# Patient Record
Sex: Female | Born: 1980 | Race: White | Hispanic: No | Marital: Married | Smoking: Never smoker
Health system: Southern US, Community
[De-identification: ages and names within clinical notes are randomized; demographics above are authoritative.]

## PROBLEM LIST (undated history)

## (undated) DIAGNOSIS — M545 Low back pain, unspecified: Secondary | ICD-10-CM

## (undated) DIAGNOSIS — M79606 Pain in leg, unspecified: Secondary | ICD-10-CM

## (undated) DIAGNOSIS — F32A Depression, unspecified: Secondary | ICD-10-CM

## (undated) DIAGNOSIS — R519 Headache, unspecified: Secondary | ICD-10-CM

## (undated) DIAGNOSIS — E7800 Pure hypercholesterolemia, unspecified: Secondary | ICD-10-CM

## (undated) DIAGNOSIS — F329 Major depressive disorder, single episode, unspecified: Secondary | ICD-10-CM

## (undated) DIAGNOSIS — R739 Hyperglycemia, unspecified: Secondary | ICD-10-CM

## (undated) DIAGNOSIS — M4316 Spondylolisthesis, lumbar region: Secondary | ICD-10-CM

## (undated) DIAGNOSIS — E039 Hypothyroidism, unspecified: Secondary | ICD-10-CM

## (undated) DIAGNOSIS — F419 Anxiety disorder, unspecified: Secondary | ICD-10-CM

## (undated) HISTORY — PX: BACK SURGERY: SHX140

## (undated) HISTORY — PX: TUBAL LIGATION: SHX77

## (undated) HISTORY — DX: Depression, unspecified: F32.A

## (undated) HISTORY — PX: CHOLECYSTECTOMY: SHX55

## (undated) HISTORY — DX: Pain in leg, unspecified: M79.606

## (undated) HISTORY — DX: Anxiety disorder, unspecified: F41.9

## (undated) HISTORY — DX: Low back pain, unspecified: M54.50

## (undated) HISTORY — DX: Spondylolisthesis, lumbar region: M43.16

## (undated) HISTORY — DX: Major depressive disorder, single episode, unspecified: F32.9

---
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
## 2001-09-21 HISTORY — PX: OTHER SURGICAL HISTORY: SHX169

## 2005-09-17 ENCOUNTER — Observation Stay: Payer: Self-pay

## 2005-09-20 ENCOUNTER — Inpatient Hospital Stay: Payer: Self-pay

## 2007-10-17 ENCOUNTER — Observation Stay: Payer: Self-pay

## 2007-10-26 ENCOUNTER — Inpatient Hospital Stay: Payer: Self-pay

## 2009-03-27 ENCOUNTER — Inpatient Hospital Stay: Payer: Self-pay

## 2013-05-22 ENCOUNTER — Emergency Department: Payer: Self-pay | Admitting: Emergency Medicine

## 2017-08-23 DIAGNOSIS — F419 Anxiety disorder, unspecified: Secondary | ICD-10-CM | POA: Insufficient documentation

## 2018-06-06 ENCOUNTER — Other Ambulatory Visit: Payer: Self-pay | Admitting: Student

## 2018-06-06 DIAGNOSIS — M5441 Lumbago with sciatica, right side: Secondary | ICD-10-CM

## 2018-06-14 ENCOUNTER — Ambulatory Visit
Admission: RE | Admit: 2018-06-14 | Discharge: 2018-06-14 | Disposition: A | Discharge status: Home / Self Care | Payer: BLUE CROSS/BLUE SHIELD | Source: Ambulatory Visit | Attending: Student | Admitting: Student

## 2018-06-14 ENCOUNTER — Encounter: Payer: Self-pay | Admitting: Radiology

## 2018-06-14 DIAGNOSIS — M4316 Spondylolisthesis, lumbar region: Secondary | ICD-10-CM | POA: Insufficient documentation

## 2018-06-14 DIAGNOSIS — M5441 Lumbago with sciatica, right side: Secondary | ICD-10-CM | POA: Diagnosis present

## 2018-06-14 DIAGNOSIS — M5126 Other intervertebral disc displacement, lumbar region: Secondary | ICD-10-CM | POA: Diagnosis not present

## 2018-10-28 ENCOUNTER — Other Ambulatory Visit: Payer: Self-pay | Admitting: Neurosurgery

## 2018-10-28 DIAGNOSIS — M4316 Spondylolisthesis, lumbar region: Secondary | ICD-10-CM

## 2018-11-03 ENCOUNTER — Ambulatory Visit
Admission: RE | Admit: 2018-11-03 | Discharge: 2018-11-03 | Disposition: A | Discharge status: Home / Self Care | Payer: BLUE CROSS/BLUE SHIELD | Source: Ambulatory Visit | Attending: Neurosurgery | Admitting: Neurosurgery

## 2018-11-03 DIAGNOSIS — M4316 Spondylolisthesis, lumbar region: Secondary | ICD-10-CM | POA: Diagnosis present

## 2018-12-13 ENCOUNTER — Other Ambulatory Visit: Payer: Self-pay

## 2018-12-13 ENCOUNTER — Encounter: Payer: Self-pay | Admitting: Student in an Organized Health Care Education/Training Program

## 2018-12-13 ENCOUNTER — Ambulatory Visit
Payer: BLUE CROSS/BLUE SHIELD | Attending: Student in an Organized Health Care Education/Training Program | Admitting: Student in an Organized Health Care Education/Training Program

## 2018-12-13 VITALS — BP 117/80 | HR 88 | Resp 16 | Ht 63.0 in | Wt 140.0 lb

## 2018-12-13 DIAGNOSIS — M5441 Lumbago with sciatica, right side: Secondary | ICD-10-CM | POA: Insufficient documentation

## 2018-12-13 DIAGNOSIS — G8929 Other chronic pain: Secondary | ICD-10-CM | POA: Insufficient documentation

## 2018-12-13 DIAGNOSIS — M5416 Radiculopathy, lumbar region: Secondary | ICD-10-CM | POA: Diagnosis not present

## 2018-12-13 DIAGNOSIS — M4317 Spondylolisthesis, lumbosacral region: Secondary | ICD-10-CM | POA: Diagnosis present

## 2018-12-13 MED ORDER — PREGABALIN 75 MG PO CAPS: ORAL_CAPSULE | ORAL | 1 refills | Status: DC

## 2018-12-13 MED ORDER — PREGABALIN 75 MG PO CAPS: ORAL_CAPSULE | ORAL | 0 refills | Status: DC

## 2018-12-13 NOTE — Progress Notes (Signed)
Patient's Name: Jody Walls  MRN: [id number]  Referring Provider: Deetta Perla, MD  DOB: [birth year]  PCP: Derinda Late, MD  DOS: 12/13/2018  Note by: Gillis Santa, MD  Service setting: Ambulatory outpatient  Specialty: Interventional Pain Management  Location: [facility] (AMB) Pain Management Facility  Visit type: Initial Patient Evaluation  Patient type: New Patient   Primary Reason(s) for Visit: Encounter for initial evaluation of one or more chronic problems (new to examiner) potentially causing chronic pain, and posing a threat to normal musculoskeletal function. (Level of risk: High) CC: Back Pain (lower bilateral right is worse )  HPI  [name] is a 38 y.o. year old, female patient, who comes today to see [place] for the first time for an initial evaluation of her chronic pain. She has Lumbar radiculopathy, chronic; Spondylolisthesis at L5-S1 level; Chronic bilateral low back pain with right-sided sciatica; and Anxiety on their problem list. Today she comes in for evaluation of her Back Pain (lower bilateral right is worse )  Pain Assessment: Location: Lower, Left, Right Back Radiating: into both legs to the knee on the right and and the same on the left  Onset: More than a month ago Duration: Chronic pain Quality: Discomfort, Constant, Aching, Burning, Nagging, Sharp, Shooting, Stabbing(annoying, deep, sickening, uncomfortable.  feels like a shredding pain) Severity: 6 /10 (subjective, self-reported pain score)  Note: Reported level is inconsistent with clinical observations.                         When using our objective Pain Scale, levels between 6 and 10/10 are said to belong in an emergency room, as it progressively worsens from a 6/10, described as severely limiting, requiring emergency care not usually available at an outpatient pain management facility. At a 6/10 level, communication becomes difficult and requires great effort. Assistance to reach the emergency department may be  required. Facial flushing and profuse sweating along with potentially dangerous increases in heart rate and blood pressure will be evident. Effect on ADL: unable to do many things around the home,  has not worked since August d/t the pain  Timing: Constant Modifying factors: nothing currently BP: 117/80  HR: 88  Onset and Duration: Sudden Cause of pain: Work related accident or event Severity: Getting worse, NAS-11 at its worse: 8/10, NAS-11 at its best: 9/10, NAS-11 now: 10/10 and NAS-11 on the average: 7/10 Timing: Morning, Afternoon, Night and After activity or exercise Aggravating Factors: Bending, Intercourse (sex), Lifiting, Motion, Prolonged sitting, Prolonged standing, Twisting and Walking Alleviating Factors: Medications Associated Problems: Depression, Fatigue, Nausea, Sadness, Spasms, Pain that wakes patient up and Pain that does not allow patient to sleep Quality of Pain: Aching, Annoying, Burning, Constant, Deep, Sharp, Shooting, Sickening, Stabbing and Uncomfortable Previous Examinations or Tests: CT scan, MRI scan, X-rays and Neurological evaluation Previous Treatments: The patient denies treatments  The patient comes into the clinics today for the first time for a chronic pain management evaluation.   38 year old female who presents with a chief complaint of axial low back as well as right leg pain.  This is been going on for the last 6 to 8 months.  She does remember an inciting event where she stood up suddenly and developed pain down her right buttock.  She states that this improved on her own.  She has been evaluated by neurosurgery, Dr. Lacinda Axon who recommended surgery however not until her BMI was less than 35.  Patient has tried gabapentin in  the past which resulted in sedation.  She is currently on Lyrica 50 mg twice a day which she finds some benefit from.  No side effects with Lyrica.  She has participated in physical therapy and tries to exercise but has seen a weight gain  of approximately 20 pounds.  She does have a history of anxiety and depression.  Patient has tried injections in the past with Dr. Sharlet Salina the last of which was on 09/22/2018 includes a right L5-S1 transfemoral ESI along with a right S1 transforaminal ESI.  She has had 2 prior transforaminal ESI's done in November 2019 in October 2019 which provided good relief for 4 to 7 days.  Patient states that she obtained a second opinion from Dr. Arnoldo Morale at [facility] neuro[facility].  She is a surgical candidate and surgery is planned however a vascular work-up was recommended prior to surgery however given the current situation with a coronavirus, the patient has not had that vascular evaluation done yet.  She presents today to discuss any interim options to help with her radicular pain.  Patient denies any bowel or bladder issues.  Patient is currently not any opioid analgesics.  We will focus on non-opioid analgesics and interventional therapies until the patient is able to have her lumbar spine surgery performed.   Historic Controlled Substance Pharmacotherapy Review  Historical Background Evaluation: [place] PMP: Six (6) year initial data search conducted.             Risk Assessment Profile:  Personal History of Substance Abuse (SUD-Substance use disorder):  Alcohol: Negative  Illegal Drugs: Negative  Rx Drugs: Negative  ORT Risk Level calculation: Moderate Risk Opioid Risk Tool - 12/13/18 1047      Family History of Substance Abuse   Alcohol  Negative    Illegal Drugs  Positive Female   father and 2 brothers    Rx Drugs  Negative      Personal History of Substance Abuse   Alcohol  Negative    Illegal Drugs  Negative    Rx Drugs  Negative      Age   Age between 51-45 years   Yes      Psychological Disease   Psychological Disease  Positive    ADD  Negative    OCD  Negative    Bipolar  Negative    Schizophrenia  Negative    Depression  Positive      Total Score   Opioid Risk Tool  Scoring  6    Opioid Risk Interpretation  Moderate Risk      ORT Scoring interpretation table:  Score <3 = Low Risk for SUD  Score between 4-7 = Moderate Risk for SUD  Score >8 = High Risk for Opioid Abuse   PHQ-2 Depression Scale:  Total score:    PHQ-2 Scoring interpretation table: (Score and probability of major depressive disorder)  Score 0 = No depression  Score 1 = 15.4% Probability  Score 2 = 21.1% Probability  Score 3 = 38.4% Probability  Score 4 = 45.5% Probability  Score 5 = 56.4% Probability  Score 6 = 78.6% Probability   PHQ-9 Depression Scale:  Total score:    PHQ-9 Scoring interpretation table:  Score 0-4 = No depression  Score 5-9 = Mild depression  Score 10-14 = Moderate depression  Score 15-19 = Moderately severe depression  Score 20-27 = Severe depression (2.4 times higher risk of SUD and 2.89 times higher risk of overuse)   Pharmacologic  Plan: Non-opioid analgesic therapy offered. Ms. Myers has expressed her preference to stay away from controlled substances. Initial impression: Pending review of available data and ordered tests.  Meds   Current Outpatient Medications:  .  levothyroxine (SYNTHROID, LEVOTHROID) 50 MCG tablet, Take 50 mcg by mouth daily., Disp: , Rfl:  .  meloxicam (MOBIC) 15 MG tablet, Take 15 mg by mouth daily., Disp: , Rfl:  .  oxybutynin (DITROPAN-XL) 10 MG 24 hr tablet, Take 10 mg by mouth daily., Disp: , Rfl:  .  pregabalin (LYRICA) 75 MG capsule, 75 mg BID for 4 weeks then increase to 75 mg TID, Disp: 90 capsule, Rfl: 1  Imaging Review  Thoracic DG Myelogram views: No results found for this or any previous visit.  Lumbosacral Imaging: Lumbar MR wo contrast:  Results for orders placed during the hospital encounter of 06/14/18  MR LUMBAR SPINE WO CONTRAST   Narrative CLINICAL DATA:  38 year old female with lumbar back pain since July. No known injury. No improvement with physical therapy. Pain radiating down the right leg to  the ankle.  EXAM: MRI LUMBAR SPINE WITHOUT CONTRAST  TECHNIQUE: Multiplanar, multisequence MR imaging of the lumbar spine was performed. No intravenous contrast was administered.  COMPARISON:  Abdominal radiographs 03/27/2009.  FINDINGS: Segmentation:  Normal on the comparison.  Alignment: Mild grade 1 anterolisthesis of L5 on S1. Preserved lumbar lordosis.  Vertebrae: Chronic bilateral pars fracture suspected at L5, with associated degenerative marrow signal changes in the facets. Elsewhere bone marrow signal is within normal limits. No marrow edema or evidence of acute osseous abnormality. Intact visible sacrum and SI joints.  Conus medullaris and cauda equina: Conus extends to the T12-L1 level. No lower spinal cord or conus signal abnormality.  Paraspinal and other soft tissues: Negative.  Disc levels:  T11-T12: Negative.  T12-L1:  Negative.  L1-L2:  Negative.  L2-L3:  Negative aside from borderline to mild facet hypertrophy.  L3-L4:  Negative aside from mild facet hypertrophy.  L4-L5: Mild disc desiccation. Minimal central disc protrusion with annular fissure best seen on series 4, image 8. Mild to moderate facet hypertrophy. No spinal stenosis or convincing neural impingement. Additionally, there are several small posteriorly situated degenerative synovial cysts which should not cause neural compromise.  L5-S1: Grade 1 anterolisthesis. Evidence of chronic bilateral pars fractures on series 5, image 33. Disc desiccation with mild circumferential disc bulging and superimposed moderate to large right foraminal disc extrusion (series 4, image 4). Superimposed moderate facet hypertrophy. Subsequent severe right L5 neural foraminal stenosis.  No spinal or lateral recess stenosis. Mild contralateral left L5 foraminal stenosis.  IMPRESSION: 1. The symptomatic level appears to be L5-S1 where chronic L5 pars fractures are associated with grade one L5-S1  spondylolisthesis, disc and facet degeneration. There is a moderate to large right foraminal disc extrusion, query Right L5 radiculitis. 2. Lesser L4-L5 disc and posterior element degeneration. No spinal stenosis or convincing neural impingement.   Electronically Signed   By: Genevie Ann M.D.   On: 06/14/2018 15:04    Results for orders placed during the hospital encounter of 11/03/18  CT LUMBAR SPINE WO CONTRAST   Narrative CLINICAL DATA:  Low back pain.  EXAM: CT LUMBAR SPINE WITHOUT CONTRAST  TECHNIQUE: Multidetector CT imaging of the lumbar spine was performed without intravenous contrast administration. Multiplanar CT image reconstructions were also generated.  COMPARISON:  MRI lumbar spine 06/14/2018.  FINDINGS: Segmentation: Standard.  Alignment: 3 mm anterolisthesis L5-5 S1, BILATERAL L5 trace retrolisthesis L4-5 is compensatory.  Otherwise anatomic. Spondylolysis.  Vertebrae: Other than BILATERAL L5 pars defects, no significant findings.  Paraspinal and other soft tissues: Unremarkable.  Disc levels:  L1-L2:  Normal.  L2-L3:  Normal.  L3-L4:  Minor facet disease. No impingement.  L4-L5: Trace retrolisthesis. Annular bulge. Facet arthropathy. No impingement.  L5-S1: 3 mm anterolisthesis. BILATERAL L5 spondylolysis. No facet arthropathy. Annular bulge centrally, with far-lateral and foraminal protrusion extending to the RIGHT, along with mild osseous spurring. Significant RIGHT L5 neural impingement is observed, see series 4, image 28. Foraminal narrowing on the LEFT without similar LEFT L5 neural impingement.  Compared with prior MR, similar appearance.  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 Significant RIGHT L5 neural impingement is observed, related to foraminal protrusion and osseous spurring. See discussion above.   Electronically Signed   By: Staci Righter M.D.   On: 11/03/2018 16:10     Complexity Note:  Imaging results reviewed. Results shared with Ms. Monte, using Layman's terms.                         ROS  Cardiovascular: No reported cardiovascular signs or symptoms such as High blood pressure, coronary artery disease, abnormal heart rate or rhythm, heart attack, blood thinner therapy or heart weakness and/or failure Pulmonary or Respiratory: No reported pulmonary signs or symptoms such as wheezing and difficulty taking a deep full breath (Asthma), difficulty blowing air out (Emphysema), coughing up mucus (Bronchitis), persistent dry cough, or temporary stoppage of breathing during sleep Neurological: No reported neurological signs or symptoms such as seizures, abnormal skin sensations, urinary and/or fecal incontinence, being born with an abnormal open spine and/or a tethered spinal cord Review of Past Neurological Studies: No results found for this or any previous visit. Psychological-Psychiatric: No reported psychological or psychiatric signs or symptoms such as difficulty sleeping, anxiety, depression, delusions or hallucinations (schizophrenial), mood swings (bipolar disorders) or suicidal ideations or attempts Gastrointestinal: No reported gastrointestinal signs or symptoms such as vomiting or evacuating blood, reflux, heartburn, alternating episodes of diarrhea and constipation, inflamed or scarred liver, or pancreas or irrregular and/or infrequent bowel movements Genitourinary: No reported renal or genitourinary signs or symptoms such as difficulty voiding or producing urine, peeing blood, non-functioning kidney, kidney stones, difficulty emptying the bladder, difficulty controlling the flow of urine, or chronic kidney disease Hematological: Brusing easily Endocrine: High thyroid Rheumatologic: No reported rheumatological signs and symptoms such as fatigue, joint pain, tenderness, swelling, redness, heat, stiffness, decreased range of motion, with or without associated  rash Musculoskeletal: Negative for myasthenia gravis, muscular dystrophy, multiple sclerosis or malignant hyperthermia Work History: Out of work due to pain  Allergies   Component Name 12/01/2018 08/25/2018 08/26/2017   92 82 95  142 140 143  3.8 3.8 3.6  107 106 107  26.8 25.8 30.0  15 14 13   0.6 0.6 0.7  111 111 94  8.7 8.8 9.1  15 31 13  16  40 (H) 11  79 86 89  4.2 4.0 4.1  0.5 0.4 0.5  6.6 6.3 6.6  1.8 1.7 1.6  Glucose  Sodium  Potassium  Chloride  Carbon Dioxide (CO2)  Urea Nitrogen (BUN)  Creatinine  Glomerular Filtration Rate (eGFR), MDRD Estimate  Calcium  AST   ALT   Alk Phos (alkaline Phosphatase)  Albumin  Bilirubin, Total  Protein, Total  A/G Ratio     Note: Lab results reviewed.  PFSH  Drug: Ms. Yellin  reports no history of  drug use. Alcohol:  reports current alcohol use. Tobacco:  reports that she has never smoked. She has never used smokeless tobacco. Medical:  has a past medical history of Anxiety and Depression. Family: family history includes COPD in her mother; Cancer in her father and mother; Drug abuse in her brother and brother; Heart disease in her mother.  Past Surgical History:  Procedure Laterality Date  . arm fracture Right 2003  . CESAREAN SECTION  2010  . CHOLECYSTECTOMY    . TUBAL LIGATION     Active Ambulatory Problems    Diagnosis Date Noted  . Lumbar radiculopathy, chronic 12/14/2018  . Spondylolisthesis at L5-S1 level 12/14/2018  . Chronic bilateral low back pain with right-sided sciatica 12/14/2018  . Anxiety 08/23/2017   Resolved Ambulatory Problems    Diagnosis Date Noted  . No Resolved Ambulatory Problems   Past Medical History:  Diagnosis Date  . Depression    Constitutional Exam  General appearance: Well nourished, well developed, and well hydrated. In no apparent acute distress Vitals:   12/13/18 1034  BP: 117/80  Pulse: 88  Resp: 16  SpO2: 97%  Weight: 140 lb (63.5 kg)  Height: 5' 3"  (1.6 m)    BMI Assessment: Estimated body mass index is 24.8 kg/m as calculated from the following:   Height as of this encounter: 5' 3"  (1.6 m).   Weight as of this encounter: 140 lb (63.5 kg).  BMI interpretation table: BMI level Category Range association with higher incidence of chronic pain  <18 kg/m2 Underweight   18.5-24.9 kg/m2 Ideal body weight   25-29.9 kg/m2 Overweight Increased incidence by 20%  30-34.9 kg/m2 Obese (Class I) Increased incidence by 68%  35-39.9 kg/m2 Severe obesity (Class II) Increased incidence by 136%  >40 kg/m2 Extreme obesity (Class III) Increased incidence by 254%   Patient's current BMI Ideal Body weight  Body mass index is 24.8 kg/m. Ideal body weight: 52.4 kg (115 lb 8.3 oz) Adjusted ideal body weight: 56.8 kg (125 lb 5 oz)   BMI Readings from Last 4 Encounters:  12/13/18 24.80 kg/m   Wt Readings from Last 4 Encounters:  12/13/18 140 lb (63.5 kg)  Psych/Mental status: Alert, oriented x 3 (person, place, & time)       Eyes: PERLA Respiratory: No evidence of acute respiratory distress  Cervical Spine Area Exam  Skin & Axial Inspection: No masses, redness, edema, swelling, or associated skin lesions Alignment: Symmetrical Functional ROM: Unrestricted ROM      Stability: No instability detected Muscle Tone/Strength: Functionally intact. No obvious neuro-muscular anomalies detected. Sensory (Neurological): Unimpaired Palpation: No palpable anomalies              Upper Extremity (UE) Exam    Side: Right upper extremity  Side: Left upper extremity  Skin & Extremity Inspection: Skin color, temperature, and hair growth are WNL. No peripheral edema or cyanosis. No masses, redness, swelling, asymmetry, or associated skin lesions. No contractures.  Skin & Extremity Inspection: Skin color, temperature, and hair growth are WNL. No peripheral edema or cyanosis. No masses, redness, swelling, asymmetry, or associated skin lesions. No contractures.  Functional ROM:  Unrestricted ROM          Functional ROM: Unrestricted ROM          Muscle Tone/Strength: Functionally intact. No obvious neuro-muscular anomalies detected.  Muscle Tone/Strength: Functionally intact. No obvious neuro-muscular anomalies detected.  Sensory (Neurological): Unimpaired          Sensory (Neurological): Unimpaired  Palpation: No palpable anomalies              Palpation: No palpable anomalies              Provocative Test(s):  Phalen's test: deferred Tinel's test: deferred Apley's scratch test (touch opposite shoulder):  Action 1 (Across chest): deferred Action 2 (Overhead): deferred Action 3 (LB reach): deferred   Provocative Test(s):  Phalen's test: deferred Tinel's test: deferred Apley's scratch test (touch opposite shoulder):  Action 1 (Across chest): deferred Action 2 (Overhead): deferred Action 3 (LB reach): deferred    Thoracic Spine Area Exam  Skin & Axial Inspection: No masses, redness, or swelling Alignment: Symmetrical Functional ROM: Unrestricted ROM Stability: No instability detected Muscle Tone/Strength: Functionally intact. No obvious neuro-muscular anomalies detected. Sensory (Neurological): Unimpaired Muscle strength & Tone: No palpable anomalies  Lumbar Spine Area Exam  Skin & Axial Inspection: No masses, redness, or swelling Alignment: Symmetrical Functional ROM: Decreased ROM affecting primarily the right Stability: No instability detected Muscle Tone/Strength: Functionally intact. No obvious neuro-muscular anomalies detected. Sensory (Neurological): Dermatomal pain pattern and musculoskeletal Palpation: No palpable anomalies       Provocative Tests: Hyperextension/rotation test: (+) bilaterally for facet joint pain. Lumbar quadrant test (Kemp's test): (+) on the right for foraminal stenosis Lateral bending test: (+) due to pain. Patrick's Maneuver: deferred today                   FABER* test: deferred today                   S-I  anterior distraction/compression test: deferred today         S-I lateral compression test: deferred today         S-I Thigh-thrust test: deferred today         S-I Gaenslen's test: deferred today         *(Flexion, ABduction and External Rotation)  Gait & Posture Assessment  Ambulation: Unassisted Gait: Relatively normal for age and body habitus Posture: WNL   Lower Extremity Exam    Side: Right lower extremity  Side: Left lower extremity  Stability: No instability observed          Stability: No instability observed          Skin & Extremity Inspection: Skin color, temperature, and hair growth are WNL. No peripheral edema or cyanosis. No masses, redness, swelling, asymmetry, or associated skin lesions. No contractures.  Skin & Extremity Inspection: Skin color, temperature, and hair growth are WNL. No peripheral edema or cyanosis. No masses, redness, swelling, asymmetry, or associated skin lesions. No contractures.  Functional ROM: Pain restricted ROM for hip and knee joints          Functional ROM: Unrestricted ROM                  Muscle Tone/Strength: Functionally intact. No obvious neuro-muscular anomalies detected.  Muscle Tone/Strength: Functionally intact. No obvious neuro-muscular anomalies detected.  Sensory (Neurological): Dermatomal pain pattern        Sensory (Neurological): Unimpaired        DTR: Patellar: 2+: normal Achilles: deferred today Plantar: deferred today  DTR: Patellar: 2+: normal Achilles: deferred today Plantar: deferred today  Palpation: No palpable anomalies  Palpation: No palpable anomalies   Assessment  Primary Diagnosis & Pertinent Problem List: The primary encounter diagnosis was Lumbar radicular pain. Diagnoses of Lumbar radiculopathy, chronic, Spondylolisthesis at L5-S1 level, and Chronic bilateral low back pain with right-sided sciatica were  also pertinent to this visit.  Visit Diagnosis (New problems to examiner): 1. Lumbar radicular pain   2.  Lumbar radiculopathy, chronic   3. Spondylolisthesis at L5-S1 level   4. Chronic bilateral low back pain with right-sided sciatica    38 year old female with history of axial low back and right leg pain related to right L5 radiculopathy as well as L5-S1 spondylolisthesis and pars defect at L5-S1.  There is also some right foraminal stenosis at L5-S1 due to disc herniation and osteophyte complex.  Patient is status post transforaminal epidural steroid injections towards the end of last year with physical medicine rehab which provided pain relief for a couple of days.  She denies having tried intralaminar epidural steroid injection.  This approach may be beneficial and provide greater pain relief as we can inject larger volume and cover more nerve roots with 1 injection.  Risks and benefits of this procedure were reviewed and patient would like to proceed.  I also briefly discussed spinal cord stimulation with the patient however since the patient has been evaluated by Dr. Arnoldo Morale with neurosurgery and surgical plan has been recommended, I will hold off on discussing this further with the patient.  Patient is also finding benefit with Lyrica without any side effects.  I encouraged her to increase to 75 mg twice daily for 3 to 4 weeks and then increase to 75 mg 3 times daily.  Prescription provided.  Plan of Care (Initial workup plan)   Ordered Lab-work, Procedure(s), Referral(s), & Consult(s): Orders Placed This Encounter  Procedures  . Lumbar Epidural Injection   Pharmacotherapy (current): Medications ordered:  Meds ordered this encounter  Medications  . DISCONTD: pregabalin (LYRICA) 75 MG capsule    Sig: Take 1 capsule (75 mg total) by mouth 2 (two) times daily for 21 days, THEN 1 capsule (75 mg total) 3 (three) times daily.    Dispense:  159 capsule    Refill:  0    Do not place this medication, or any other prescription from our practice, on "Automatic Refill". Patient may have  prescription filled one day early if pharmacy is closed on scheduled refill date.  Marland Kitchen DISCONTD: pregabalin (LYRICA) 75 MG capsule    Sig: 75 mg BID for 4 weeks then increase to 75 mg TID    Dispense:  90 capsule    Refill:  1    Do not place this medication, or any other prescription from our practice, on "Automatic Refill". Patient may have prescription filled one day early if pharmacy is closed on scheduled refill date.  . pregabalin (LYRICA) 75 MG capsule    Sig: 75 mg BID for 4 weeks then increase to 75 mg TID    Dispense:  90 capsule    Refill:  1    Do not place this medication, or any other prescription from our practice, on "Automatic Refill". Patient may have prescription filled one day early if pharmacy is closed on scheduled refill date.   Medications administered during this visit: Evaline B. Hoefer had no medications administered during this visit.   Provider-requested follow-up: Return for Procedure.  No future appointments.  Primary Care Physician: Derinda Late, MD Location: [facility] [facility] Note by: Gillis Santa, M.D, Date: 12/13/2018; Time: 8:15 AM  Patient Instructions   Preparing for your procedure (without sedation) Instructions: . Oral Intake: Do not eat or drink anything for at least 3 hours prior to your procedure. . Transportation: Unless otherwise stated by your physician, you  may drive yourself after the procedure. . Blood Pressure Medicine: Take your blood pressure medicine with a sip of water the morning of the procedure. . Insulin: Take only  of your normal insulin dose. . Preventing infections: Shower with an antibacterial soap the morning of your procedure. . Build-up your immune system: Take 1000 mg of Vitamin C with every meal (3 times a day) the day prior to your procedure. . Pregnancy: If you are pregnant, call and cancel the procedure. . Sickness: If you have a cold, fever, or any active infections, call and cancel the  procedure. . Arrival: You must be in the facility at least 30 minutes prior to your scheduled procedure. . Children: Do not bring any children with you. . Dress appropriately: Bring dark clothing that you would not mind if they get stained. . Valuables: Do not bring any jewelry or valuables. Procedure appointments are reserved for interventional treatments only. Marland Kitchen No Prescription Refills. . No medication changes will be discussed during procedure appointments. No disability issues will be discussed. Epidural Steroid Injection An epidural steroid injection is given to relieve pain in your neck, back, or legs that is caused by the irritation or swelling of a nerve root. This procedure involves injecting a steroid and numbing medicine (anesthetic) into the epidural space. The epidural space is the space between the outer covering of your spinal cord and the bones that form your backbone (vertebra).  LET [facility] CARE PROVIDER KNOW ABOUT:  Any allergies you have. All medicines you are taking, including vitamins, herbs, eye drops, creams, and over-the-counter medicines such as aspirin. Previous problems you or members of your family have had with the use of anesthetics. Any blood disorders or blood clotting disorders you have. Previous surgeries you have had. Medical conditions you have.  RISKS AND COMPLICATIONS Generally, this is a safe procedure. However, as with any procedure, complications can occur. Possible complications of epidural steroid injection include: Headache. Bleeding. Infection. Allergic reaction to the medicines. Damage to your nerves. The response to this procedure depends on the underlying cause of the pain and its duration. People who have long-term (chronic) pain are less likely to benefit from epidural steroids than are those people whose pain comes on strong and suddenly.  BEFORE THE PROCEDURE  Ask your health care provider about changing or stopping your regular  medicines. You may be advised to stop taking blood-thinning medicines a few days before the procedure. You may be given medicines to reduce anxiety. Arrange for someone to take you home after the procedure.  PROCEDURE  You will remain awake during the procedure. You may receive medicine to make you relaxed. You will be asked to lie on your stomach. The injection site will be cleaned. The injection site will be numbed with a medicine (local anesthetic). A needle will be injected through your skin into the epidural space. Your health care provider will use an X-ray machine to ensure that the steroid is delivered closest to the affected nerve. You may have minimal discomfort at this time. Once the needle is in the right position, the local anesthetic and the steroid will be injected into the epidural space. The needle will then be removed and a bandage will be applied to the injection site.  AFTER THE PROCEDURE  You may be monitored for a short time before you go home. You may feel weakness or numbness in your arm or leg, which disappears within hours. You may be allowed to eat, drink, and take  your regular medicine. You may have soreness at the site of the injection.   This information is not intended to replace advice given to you by your health care provider. Make sure you discuss any questions you have with your health care provider.   Document Released: 12/15/2007 Document Revised: 05/10/2013 Document Reviewed: 02/24/2013 Elsevier Interactive Patient Education 2016 [place]  What are the risk, side effects and possible complications? Generally speaking, most procedures are safe.  However, with any procedure there are risks, side effects, and the possibility of complications.  The risks and complications are dependent upon the sites that are lesioned, or the type of nerve block to be performed.  The closer the procedure is to the spine, the more  serious the risks are.  Great care is taken when placing the radio frequency needles, block needles or lesioning probes, but sometimes complications can occur. Infection: Any time there is an injection through the skin, there is a risk of infection.  This is why sterile conditions are used for these blocks. There are four possible types of infection: 1. Localized skin infection. 2. Central Nervous System Infection: This can be in the form of Meningitis, which can be deadly. 3. Epidural Infections: This can be in the form of an epidural abscess, which can cause pressure inside of the spine, causing compression of the spinal cord with subsequent paralysis. This would require an emergency surgery to decompress, and there are no guarantees that the patient would recover from the paralysis. 4. Discitis: This is an infection of the intervertebral discs. It occurs in about 1% of discography procedures. It is difficult to treat and it may lead to surgery. Pain: the needles have to go through skin and soft tissues, will cause soreness. Damage to internal structures:  The nerves to be lesioned may be near blood vessels or other nerves which can be potentially damaged. Bleeding: Bleeding is more common if the patient is taking blood thinners such as  aspirin, Coumadin, Ticiid, Plavix, etc., or if he/she have some genetic predisposition such as hemophilia. Bleeding into the spinal canal can cause compression of the spinal  cord with subsequent paralysis.  This would require an emergency surgery to decompress and there are no guarantees that the patient would recover from the paralysis. Pneumothorax: Puncturing of a lung is a possibility, every time a needle is introduced in the area of the chest or upper back.  Pneumothorax refers to free air around the collapsed lung(s), inside of the thoracic cavity (chest cavity).  Another two possible complications related to a similar event would include: Hemothorax and  Chylothorax. These are variations of the Pneumothorax, where instead of air around the collapsed lung(s), you may have blood or chyle, respectively. Spinal headaches: They may occur with any procedures in the area of the spine. Persistent CSF (Cerebro-Spinal Fluid) leakage: This is a rare problem, but may occur with prolonged intrathecal or epidural catheters either due to the formation of a fistulous track or a dural tear. Nerve damage: By working so close to the spinal cord, there is always a possibility of nerve damage, which could be as serious as a permanent spinal cord injury with paralysis. Death: Although rare, severe deadly allergic reactions known as "Anaphylactic reaction" can occur to any of the medications used. Worsening of the symptoms: We can always make thing worse.  What are the chances of something like this happening? Chances of any of this occuring are extremely low.  By statistics, you have more of a chance of getting killed in a motor vehicle accident: while driving to the hospital than any of the above occurring .  Nevertheless, you should be aware that they are possibilities.  In general, it is similar to taking a shower.  Everybody knows that you can slip, hit your head and get killed.  Does that mean that you should not shower again?  Nevertheless always keep in mind that statistics do not mean anything if you happen to be on the wrong side of them.  Even if a procedure has a 1 (one) in a 1,000,000 (million) chance of going wrong, it you happen to be that one..Also, keep in mind that by statistics, you have more of a chance of having something go wrong when taking medications.  Who should not have this procedure? If you are on a blood thinning medication (e.g. Coumadin, Plavix, see list of "Blood Thinners"), or if you have an active infection going on, you should not have the procedure.  If you are taking any blood thinners, please inform your physician.  Preparing for your  procedure: Do not eat or drink anything at least eight (8) hours prior to the procedure. Bring a driver with you .  It cannot be a taxi. Come accompanied by an adult that can drive you back, and that is strong enough to help you if your legs get weak or numb from the local anesthetic. Take all of your medicines the morning of the procedure with just enough water to swallow them. If you have diabetes, make sure that you are scheduled to have your procedure done first thing in the morning, whenever possible. If you have diabetes, take only half of your insulin dose and notify our nurse that you have done so as soon as you arrive at the clinic. If you are diabetic, but only take blood sugar pills (oral hypoglycemic), then do not take them on the morning of your procedure.  You may take them after you have had the procedure. Do not take aspirin or any aspirin-containing medications, at least eleven (11) days prior to the procedure.  They may prolong bleeding. Wear loose fitting clothing that may be easy to take off and that you would not mind if it got stained with Betadine or blood. Do not wear any jewelry or perfume Remove any nail coloring.  It will interfere with some of our monitoring equipment. If you take Metformin for your diabetes, stop it 48 hours prior to the procedure.  NOTE: Remember that this is not meant to be interpreted as a complete list of all possible complications.  Unforeseen problems may occur.  BLOOD THINNERS The following drugs contain aspirin or other products, which can cause increased bleeding during surgery and should not be taken for 2 weeks prior to and 1 week after surgery.  If you should need take something for relief of minor pain, you may take acetaminophen which is found in Tylenol,m Datril, Anacin-3 and Panadol. It is not blood thinner. The products listed below are.  Do not take any of the products listed below in addition to any listed on your instruction  sheet.  A.P.C or A.P.C with Codeine Codeine Phosphate Capsules #3 Ibuprofen Ridaura  ABC compound Congesprin Imuran rimadil  Advil Cope Indocin Robaxisal  Alka-Seltzer Effervescent Pain Reliever and Antacid Coricidin or Coricidin-D  Indomethacin Rufen  Alka-Seltzer plus Cold Medicine Cosprin Ketoprofen S-A-C Tablets  Anacin Analgesic Tablets or Capsules Coumadin Korlgesic Salflex  Anacin Extra Strength Analgesic tablets or capsules CP-2 Tablets Lanoril Salicylate  Anaprox Cuprimine Capsules Levenox Salocol  Anexsia-D Dalteparin Magan Salsalate  Anodynos Darvon compound Magnesium Salicylate Sine-off  Ansaid Dasin Capsules Magsal Sodium Salicylate  Anturane Depen Capsules Marnal Soma  APF Arthritis pain formula Dewitt's Pills Measurin Stanback  Argesic Dia-Gesic Meclofenamic Sulfinpyrazone  Arthritis Bayer Timed Release Aspirin Diclofenac Meclomen Sulindac  Arthritis pain formula Anacin Dicumarol Medipren Supac  Analgesic (Safety coated) Arthralgen Diffunasal Mefanamic Suprofen  Arthritis Strength Bufferin Dihydrocodeine Mepro Compound Suprol  Arthropan liquid Dopirydamole Methcarbomol with Aspirin Synalgos  ASA tablets/Enseals Disalcid Micrainin Tagament  Ascriptin Doan's Midol Talwin  Ascriptin A/D Dolene Mobidin Tanderil  Ascriptin Extra Strength Dolobid Moblgesic Ticlid  Ascriptin with Codeine Doloprin or Doloprin with Codeine Momentum Tolectin  Asperbuf Duoprin Mono-gesic Trendar  Aspergum Duradyne Motrin or Motrin IB Triminicin  Aspirin plain, buffered or enteric coated Durasal Myochrisine Trigesic  Aspirin Suppositories Easprin Nalfon Trillsate  Aspirin with Codeine Ecotrin Regular or Extra Strength Naprosyn Uracel  Atromid-S Efficin Naproxen Ursinus  Auranofin Capsules Elmiron Neocylate Vanquish  Axotal Emagrin Norgesic Verin  Azathioprine Empirin or Empirin with Codeine Normiflo Vitamin E  Azolid Emprazil Nuprin Voltaren  Bayer Aspirin plain, buffered or children's or  timed BC Tablets or powders Encaprin Orgaran Warfarin Sodium  Buff-a-Comp Enoxaparin Orudis Zorpin  Buff-a-Comp with Codeine Equegesic Os-Cal-Gesic   Buffaprin Excedrin plain, buffered or Extra Strength Oxalid   Bufferin Arthritis Strength Feldene Oxphenbutazone   Bufferin plain or Extra Strength Feldene Capsules Oxycodone with Aspirin   Bufferin with Codeine Fenoprofen Fenoprofen Pabalate or Pabalate-SF   Buffets II Flogesic Panagesic   Buffinol plain or Extra Strength Florinal or Florinal with Codeine Panwarfarin   Buf-Tabs Flurbiprofen Penicillamine   Butalbital Compound Four-way cold tablets Penicillin   Butazolidin Fragmin Pepto-Bismol   Carbenicillin Geminisyn Percodan   Carna Arthritis Reliever Geopen Persantine   Carprofen Gold's salt Persistin   Chloramphenicol Goody's Phenylbutazone   Chloromycetin Haltrain Piroxlcam   Clmetidine heparin Plaquenil   Cllnoril Hyco-pap Ponstel   Clofibrate Hydroxy chloroquine Propoxyphen         . Before stopping any of these medications, be sure to consult the physician who ordered them.  Some, such as Coumadin (Warfarin) are ordered to prevent or treat serious conditions such as "deep thrombosis", "pumonary embolisms", and other heart problems.  The amount of time that you may need off of the medication may also vary with the medication and the reason for which you were taking it.  If you are taking any of these medications, please make sure you notify your pain physician before you undergo any procedures.

## 2018-12-13 NOTE — Patient Instructions (Signed)
Preparing for your procedure (without sedation) Instructions: Oral Intake: Do not eat or drink anything for at least 3 hours prior to your procedure. Transportation: Unless otherwise stated by your physician, you may drive yourself after the procedure. Blood Pressure Medicine: Take your blood pressure medicine with a sip of water the morning of the procedure. Insulin: Take only  of your normal insulin dose. Preventing infections: Shower with an antibacterial soap the morning of your procedure. Build-up your immune system: Take 1000 mg of Vitamin C with every meal (3 times a day) the day prior to your procedure. Pregnancy: If you are pregnant, call and cancel the procedure. Sickness: If you have a cold, fever, or any active infections, call and cancel the procedure. Arrival: You must be in the facility at least 30 minutes prior to your scheduled procedure. Children: Do not bring any children with you. Dress appropriately: Bring dark clothing that you would not mind if they get stained. Valuables: Do not bring any jewelry or valuables. Procedure appointments are reserved for interventional treatments only. No Prescription Refills. No medication changes will be discussed during procedure appointments. No disability issues will be discussed. ____________________________________________________________________________________________  Epidural Steroid Injection  An epidural steroid injection is given to relieve pain in your neck, back, or legs that is caused by the irritation or swelling of a nerve root. This procedure involves injecting a steroid and numbing medicine (anesthetic) into the epidural space. The epidural space is the space between the outer covering of your spinal cord and the bones that form your backbone (vertebra).  LET YOUR HEALTH CARE PROVIDER KNOW ABOUT:  Any allergies you have. All medicines you are taking, including vitamins, herbs, eye drops, creams, and over-the-counter  medicines such as aspirin. Previous problems you or members of your family have had with the use of anesthetics. Any blood disorders or blood clotting disorders you have. Previous surgeries you have had. Medical conditions you have.  RISKS AND COMPLICATIONS Generally, this is a safe procedure. However, as with any procedure, complications can occur. Possible complications of epidural steroid injection include: Headache. Bleeding. Infection. Allergic reaction to the medicines. Damage to your nerves. The response to this procedure depends on the underlying cause of the pain and its duration. People who have long-term (chronic) pain are less likely to benefit from epidural steroids than are those people whose pain comes on strong and suddenly.  BEFORE THE PROCEDURE  Ask your health care provider about changing or stopping your regular medicines. You may be advised to stop taking blood-thinning medicines a few days before the procedure. You may be given medicines to reduce anxiety. Arrange for someone to take you home after the procedure.  PROCEDURE  You will remain awake during the procedure. You may receive medicine to make you relaxed. You will be asked to lie on your stomach. The injection site will be cleaned. The injection site will be numbed with a medicine (local anesthetic). A needle will be injected through your skin into the epidural space. Your health care provider will use an X-ray machine to ensure that the steroid is delivered closest to the affected nerve. You may have minimal discomfort at this time. Once the needle is in the right position, the local anesthetic and the steroid will be injected into the epidural space. The needle will then be removed and a bandage will be applied to the injection site.  AFTER THE PROCEDURE  You may be monitored for a short time before you go home. You may   feel weakness or numbness in your arm or leg, which disappears within hours. You  may be allowed to eat, drink, and take your regular medicine. You may have soreness at the site of the injection.   This information is not intended to replace advice given to you by your health care provider. Make sure you discuss any questions you have with your health care provider.   Document Released: 12/15/2007 Document Revised: 05/10/2013 Document Reviewed: 02/24/2013 Elsevier Interactive Patient Education 2016 [employer]  GENERAL RISKS AND COMPLICATIONS  What are the risk, side effects and possible complications? Generally speaking, most procedures are safe.  However, with any procedure there are risks, side effects, and the possibility of complications.  The risks and complications are dependent upon the sites that are lesioned, or the type of nerve block to be performed.  The closer the procedure is to the spine, the more serious the risks are.  Great care is taken when placing the radio frequency needles, block needles or lesioning probes, but sometimes complications can occur. Infection: Any time there is an injection through the skin, there is a risk of infection.  This is why sterile conditions are used for these blocks. There are four possible types of infection: 1. Localized skin infection. 2. Central Nervous System Infection: This can be in the form of Meningitis, which can be deadly. 3. Epidural Infections: This can be in the form of an epidural abscess, which can cause pressure inside of the spine, causing compression of the spinal cord with subsequent paralysis. This would require an emergency surgery to decompress, and there are no guarantees that the patient would recover from the paralysis. 4. Discitis: This is an infection of the intervertebral discs. It occurs in about 1% of discography procedures. It is difficult to treat and it may lead to surgery. Pain: the needles have to go through skin and soft tissues, will cause soreness. Damage to internal structures:  The  nerves to be lesioned may be near blood vessels or other nerves which can be potentially damaged. Bleeding: Bleeding is more common if the patient is taking blood thinners such as  aspirin, Coumadin, Ticiid, Plavix, etc., or if he/she have some genetic predisposition such as hemophilia. Bleeding into the spinal canal can cause compression of the spinal  cord with subsequent paralysis.  This would require an emergency surgery to decompress and there are no guarantees that the patient would recover from the paralysis. Pneumothorax: Puncturing of a lung is a possibility, every time a needle is introduced in the area of the chest or upper back.  Pneumothorax refers to free air around the collapsed lung(s), inside of the thoracic cavity (chest cavity).  Another two possible complications related to a similar event would include: Hemothorax and Chylothorax. These are variations of the Pneumothorax, where instead of air around the collapsed lung(s), you may have blood or chyle, respectively. Spinal headaches: They may occur with any procedures in the area of the spine. Persistent CSF (Cerebro-Spinal Fluid) leakage: This is a rare problem, but may occur with prolonged intrathecal or epidural catheters either due to the formation of a fistulous track or a dural tear. Nerve damage: By working so close to the spinal cord, there is always a possibility of nerve damage, which could be as serious as a permanent spinal cord injury with paralysis. Death: Although rare, severe deadly allergic reactions known as "Anaphylactic reaction" can occur to any of the medications used. Worsening of the symptoms: We can always make   thing worse.  What are the chances of something like this happening? Chances of any of this occuring are extremely low.  By statistics, you have more of a chance of getting killed in a motor vehicle accident: while driving to the hospital than any of the above occurring .  Nevertheless, you should be aware  that they are possibilities.  In general, it is similar to taking a shower.  Everybody knows that you can slip, hit your head and get killed.  Does that mean that you should not shower again?  Nevertheless always keep in mind that statistics do not mean anything if you happen to be on the wrong side of them.  Even if a procedure has a 1 (one) in a 1,000,000 (million) chance of going wrong, it you happen to be that one..Also, keep in mind that by statistics, you have more of a chance of having something go wrong when taking medications.  Who should not have this procedure? If you are on a blood thinning medication (e.g. Coumadin, Plavix, see list of "Blood Thinners"), or if you have an active infection going on, you should not have the procedure.  If you are taking any blood thinners, please inform your physician.  Preparing for your procedure: Do not eat or drink anything at least eight (8) hours prior to the procedure. Bring a driver with you .  It cannot be a taxi. Come accompanied by an adult that can drive you back, and that is strong enough to help you if your legs get weak or numb from the local anesthetic. Take all of your medicines the morning of the procedure with just enough water to swallow them. If you have diabetes, make sure that you are scheduled to have your procedure done first thing in the morning, whenever possible. If you have diabetes, take only half of your insulin dose and notify our nurse that you have done so as soon as you arrive at the clinic. If you are diabetic, but only take blood sugar pills (oral hypoglycemic), then do not take them on the morning of your procedure.  You may take them after you have had the procedure. Do not take aspirin or any aspirin-containing medications, at least eleven (11) days prior to the procedure.  They may prolong bleeding. Wear loose fitting clothing that may be easy to take off and that you would not mind if it got stained with Betadine or  blood. Do not wear any jewelry or perfume Remove any nail coloring.  It will interfere with some of our monitoring equipment. If you take Metformin for your diabetes, stop it 48 hours prior to the procedure.  NOTE: Remember that this is not meant to be interpreted as a complete list of all possible complications.  Unforeseen problems may occur.  BLOOD THINNERS The following drugs contain aspirin or other products, which can cause increased bleeding during surgery and should not be taken for 2 weeks prior to and 1 week after surgery.  If you should need take something for relief of minor pain, you may take acetaminophen which is found in Tylenol,m Datril, Anacin-3 and Panadol. It is not blood thinner. The products listed below are.  Do not take any of the products listed below in addition to any listed on your instruction sheet.  A.P.C or A.P.C with Codeine Codeine Phosphate Capsules #3 Ibuprofen Ridaura  ABC compound Congesprin Imuran rimadil  Advil Cope Indocin Robaxisal  Alka-Seltzer Effervescent Pain Reliever and Antacid Coricidin or   Coricidin-D  Indomethacin Rufen  Alka-Seltzer plus Cold Medicine Cosprin Ketoprofen S-A-C Tablets  Anacin Analgesic Tablets or Capsules Coumadin Korlgesic Salflex  Anacin Extra Strength Analgesic tablets or capsules CP-2 Tablets Lanoril Salicylate  Anaprox Cuprimine Capsules Levenox Salocol  Anexsia-D Dalteparin Magan Salsalate  Anodynos Darvon compound Magnesium Salicylate Sine-off  Ansaid Dasin Capsules Magsal Sodium Salicylate  Anturane Depen Capsules Marnal Soma  APF Arthritis pain formula Dewitt's Pills Measurin Stanback  Argesic Dia-Gesic Meclofenamic Sulfinpyrazone  Arthritis Bayer Timed Release Aspirin Diclofenac Meclomen Sulindac  Arthritis pain formula Anacin Dicumarol Medipren Supac  Analgesic (Safety coated) Arthralgen Diffunasal Mefanamic Suprofen  Arthritis Strength Bufferin Dihydrocodeine Mepro Compound Suprol  Arthropan liquid Dopirydamole  Methcarbomol with Aspirin Synalgos  ASA tablets/Enseals Disalcid Micrainin Tagament  Ascriptin Doan's Midol Talwin  Ascriptin A/D Dolene Mobidin Tanderil  Ascriptin Extra Strength Dolobid Moblgesic Ticlid  Ascriptin with Codeine Doloprin or Doloprin with Codeine Momentum Tolectin  Asperbuf Duoprin Mono-gesic Trendar  Aspergum Duradyne Motrin or Motrin IB Triminicin  Aspirin plain, buffered or enteric coated Durasal Myochrisine Trigesic  Aspirin Suppositories Easprin Nalfon Trillsate  Aspirin with Codeine Ecotrin Regular or Extra Strength Naprosyn Uracel  Atromid-S Efficin Naproxen Ursinus  Auranofin Capsules Elmiron Neocylate Vanquish  Axotal Emagrin Norgesic Verin  Azathioprine Empirin or Empirin with Codeine Normiflo Vitamin E  Azolid Emprazil Nuprin Voltaren  Bayer Aspirin plain, buffered or children's or timed BC Tablets or powders Encaprin Orgaran Warfarin Sodium  Buff-a-Comp Enoxaparin Orudis Zorpin  Buff-a-Comp with Codeine Equegesic Os-Cal-Gesic   Buffaprin Excedrin plain, buffered or Extra Strength Oxalid   Bufferin Arthritis Strength Feldene Oxphenbutazone   Bufferin plain or Extra Strength Feldene Capsules Oxycodone with Aspirin   Bufferin with Codeine Fenoprofen Fenoprofen Pabalate or Pabalate-SF   Buffets II Flogesic Panagesic   Buffinol plain or Extra Strength Florinal or Florinal with Codeine Panwarfarin   Buf-Tabs Flurbiprofen Penicillamine   Butalbital Compound Four-way cold tablets Penicillin   Butazolidin Fragmin Pepto-Bismol   Carbenicillin Geminisyn Percodan   Carna Arthritis Reliever Geopen Persantine   Carprofen Gold's salt Persistin   Chloramphenicol Goody's Phenylbutazone   Chloromycetin Haltrain Piroxlcam   Clmetidine heparin Plaquenil   Cllnoril Hyco-pap Ponstel   Clofibrate Hydroxy chloroquine Propoxyphen         Before stopping any of these medications, be sure to consult the physician who ordered them.  Some, such as Coumadin (Warfarin) are ordered  to prevent or treat serious conditions such as "deep thrombosis", "pumonary embolisms", and other heart problems.  The amount of time that you may need off of the medication may also vary with the medication and the reason for which you were taking it.  If you are taking any of these medications, please make sure you notify your pain physician before you undergo any procedures. ____________________________________________________________________________________________  

## 2018-12-13 NOTE — Progress Notes (Signed)
Safety precautions to be maintained throughout the outpatient stay will include: orient to surroundings, keep bed in low position, maintain call bell within reach at all times, provide assistance with transfer out of bed and ambulation.  

## 2018-12-14 DIAGNOSIS — G8929 Other chronic pain: Secondary | ICD-10-CM | POA: Insufficient documentation

## 2018-12-14 DIAGNOSIS — M5416 Radiculopathy, lumbar region: Secondary | ICD-10-CM | POA: Insufficient documentation

## 2018-12-14 DIAGNOSIS — M4317 Spondylolisthesis, lumbosacral region: Secondary | ICD-10-CM | POA: Insufficient documentation

## 2018-12-14 DIAGNOSIS — M5441 Lumbago with sciatica, right side: Secondary | ICD-10-CM

## 2019-02-23 ENCOUNTER — Other Ambulatory Visit: Payer: Self-pay | Admitting: Neurosurgery

## 2019-02-23 ENCOUNTER — Other Ambulatory Visit: Payer: Self-pay | Admitting: *Deleted

## 2019-03-28 ENCOUNTER — Encounter: Payer: Self-pay | Admitting: Vascular Surgery

## 2019-03-28 ENCOUNTER — Other Ambulatory Visit: Payer: Self-pay

## 2019-03-28 ENCOUNTER — Ambulatory Visit (INDEPENDENT_AMBULATORY_CARE_PROVIDER_SITE_OTHER): Payer: BLUE CROSS/BLUE SHIELD | Admitting: Vascular Surgery

## 2019-03-28 VITALS — BP 127/88 | HR 68 | Temp 97.9°F | Resp 14 | Ht 63.0 in | Wt 248.8 lb

## 2019-03-28 DIAGNOSIS — M5137 Other intervertebral disc degeneration, lumbosacral region: Secondary | ICD-10-CM

## 2019-03-28 NOTE — Progress Notes (Signed)
[facility]  Patient name: Jody Walls MRN: [id number] DOB: 1980/10/23 Sex: female  REASON FOR CONSULT: Discuss anterior exposure for L5-S1 lumbar disc surgery  HPI: Jody Walls is a 38 y.o. female, who is referred by Dr. Kary Kos for discussion of anterior exposure for L5-S1 disc fusion.  She reports a long history of degenerative disc disease with back pain and now bilateral leg pain.  This is progressed to the point that she is not able to do her daily activities.  Has been evaluated and is recommended for anterior exposure for fusion.  She is otherwise healthy.  She has had prior laparoscopic cholecystectomy and cesarean section.  No other abdominal surgery.  She does not have any cardiac disease.  Past Medical History:  Diagnosis Date  . Anxiety   . Depression   . Leg pain, posterior    Down to her calf.  . Low back pain   . Spondylolisthesis of lumbar region    With some radiculopathy into her legs    Family History  Problem Relation Age of Onset  . COPD Mother   . Cancer Mother   . Heart disease Mother   . Cancer Father   . Drug abuse Brother   . Drug abuse Brother     SOCIAL HISTORY: Social History   Socioeconomic History  . Marital status: Married    Spouse name: Not on file  . Number of children: Not on file  . Years of education: Not on file  . Highest education level: Not on file  Occupational History  . Not on file  Social Needs  . Financial resource strain: Not on file  . Food insecurity    Worry: Not on file    Inability: Not on file  . Transportation needs    Medical: Not on file    Non-medical: Not on file  Tobacco Use  . Smoking status: Never Smoker  . Smokeless tobacco: Never Used  Substance and Sexual Activity  . Alcohol use: Yes    Comment: occassionally   . Drug use: Never  . Sexual activity: Not on file  Lifestyle  . Physical activity    Days per week: Not  on file    Minutes per session: Not on file  . Stress: Not on file  Relationships  . Social Herbalist on phone: Not on file    Gets together: Not on file    Attends religious service: Not on file    Active member of club or organization: Not on file    Attends meetings of clubs or organizations: Not on file    Relationship status: Not on file  . Intimate partner violence    Fear of current or ex partner: Not on file    Emotionally abused: Not on file    Physically abused: Not on file    Forced sexual activity: Not on file  Other Topics Concern  . Not on file  Social History Narrative  . Not on file    Allergies  Allergen Reactions  . Shellfish Allergy Anaphylaxis and Hives    Current Outpatient Medications  Medication Sig Dispense Refill  . aspirin-acetaminophen-caffeine (EXCEDRIN MIGRAINE) 250-250-65 MG tablet Take 1 tablet by mouth every 6 (six) hours as needed for headache.    . levothyroxine (SYNTHROID, LEVOTHROID) 50 MCG tablet Take 50 mcg by mouth daily before breakfast.     . meloxicam (MOBIC) 15 MG tablet  Take 15 mg by mouth daily.    Marland Kitchen. oxybutynin (DITROPAN-XL) 10 MG 24 hr tablet Take 10 mg by mouth daily.    . pregabalin (LYRICA) 50 MG capsule Take 50 mg by mouth 2 (two) times a day.     No current facility-administered medications for this visit.     REVIEW OF SYSTEMS:  [X]  denotes positive finding, [ ]  denotes negative finding Cardiac  Comments:  Chest pain or chest pressure:    Shortness of breath upon exertion:    Short of breath when lying flat:    Irregular heart rhythm:        Vascular    Pain in calf, thigh, or hip brought on by ambulation:    Pain in feet at night that wakes you up from your sleep:     Blood clot in your veins:    Leg swelling:         Pulmonary    Oxygen at home:    Productive cough:     Wheezing:         Neurologic    Sudden weakness in arms or legs:     Sudden numbness in arms or legs:     Sudden onset of  difficulty speaking or slurred speech:    Temporary loss of vision in one eye:     Problems with dizziness:         Gastrointestinal    Blood in stool:     Vomited blood:         Genitourinary    Burning when urinating:     Blood in urine:        Psychiatric    Major depression:         Hematologic    Bleeding problems:    Problems with blood clotting too easily:        Skin    Rashes or ulcers:        Constitutional    Fever or chills:      PHYSICAL EXAM: Vitals:   03/28/19 1243  BP: 127/88  Pulse: 68  Resp: 14  Temp: 97.9 F (36.6 C)  TempSrc: Temporal  SpO2: 98%  Weight: 248 lb 12.8 oz (112.9 kg)  Height: 5\' 3"  (1.6 m)    GENERAL: The patient is a well-nourished female, in no acute distress. The vital signs are documented above. CARDIOVASCULAR: 2+ radial and 2+ dorsalis pedis pulses.  Carotid arteries without bruits bilaterally PULMONARY: There is good air exchange  ABDOMEN: Soft and non-tender  MUSCULOSKELETAL: There are no major deformities or cyanosis. NEUROLOGIC: No focal weakness or paresthesias are detected. SKIN: There are no ulcers or rashes noted. PSYCHIATRIC: The patient has a normal affect.  DATA:  Reviewed the CT scan of her lumbar spine from 11/03/2018.  This shows no atherosclerotic change  MEDICAL ISSUES: I discussed my role for exposure for anterior spine surgery.  I explained the mobilization of the rectus muscle and retroperitoneal dissection.  Discussed mobilization of the left ureter and arterial venous structures overlying the spine.  Discussed potential injury for all these to include major venous injury.  Patient understands and wishes to proceed as scheduled for later this month   Larina Earthlyodd F. Early, MD [facility] Office Tel [phone] Pager [phone]

## 2019-04-03 NOTE — Pre-Procedure Instructions (Signed)
[facility], Magdalena [place] [address] [place] [address] Phone: [phone] Fax: [phone]  [id number] [address], [place] [address] Phone: [phone] Fax: [phone]      Your procedure is scheduled on Wednesday 04-12-19  Report to [facility] Main Entrance "A" at 0530A.M., and check in at the Admitting office.  Call this number if you have problems the morning of surgery:  [phone]  Call [phone] if you have any questions prior to your surgery date Monday-Friday 8am-4pm    Remember:  Do not eat or drink after midnight the night before your surgery   Take these medicines the morning of surgery with A SIP OF WATER : levothyroxine (SYNTHROID, LEVOTHROID)   oxybutynin (DITROPAN-XL)  pregabalin (LYRICA)  7 days prior to surgery STOP taking MOBIC, EXCEDRIN,any Aspirin (unless otherwise instructed by your surgeon), Aleve, Naproxen, Ibuprofen, Motrin, Advil, Goody's, BC's, all herbal medications, fish oil, and all vitamins.   The Morning of Surgery  Do not wear jewelry, make-up or nail polish.  Do not wear lotions, powders, or perfumes, or deodorant  Do not shave 48 hours prior to surgery.    Do not bring valuables to the hospital.  [facility] is not responsible for any belongings or valuables.  If you are a smoker, DO NOT Smoke 24 hours prior to surgery IF you wear a CPAP at night please bring your mask, tubing, and machine the morning of surgery   Remember that you must have someone to transport you home after your surgery, and remain with you for 24 hours if you are discharged the same day.   Contacts, glasses, hearing aids, dentures or bridgework may not be worn into surgery.    Leave your suitcase in the car.  After surgery it may be brought to your room.  For patients admitted to the hospital, discharge time will be determined by your treatment  team.  Patients discharged the day of surgery will not be allowed to drive home.    Special instructions:   [facility]- Preparing For Surgery  Before surgery, you can play an important role. Because skin is not sterile, your skin needs to be as free of germs as possible. You can reduce the number of germs on your skin by washing with CHG (chlorahexidine gluconate) Soap before surgery.  CHG is an antiseptic cleaner which kills germs and bonds with the skin to continue killing germs even after washing.    Oral Hygiene is also important to reduce your risk of infection.  Remember - BRUSH YOUR TEETH THE MORNING OF SURGERY WITH YOUR REGULAR TOOTHPASTE  Please do not use if you have an allergy to CHG or antibacterial soaps. If your skin becomes reddened/irritated stop using the CHG.  Do not shave (including legs and underarms) for at least 48 hours prior to first CHG shower. It is OK to shave your face.  Please follow these instructions carefully.   1. Shower the NIGHT BEFORE SURGERY and the MORNING OF SURGERY with CHG Soap.   2. If you chose to wash your hair, wash your hair first as usual with your normal shampoo.  3. After you shampoo, rinse your hair and body thoroughly to remove the shampoo.  4. Use CHG as you would any other liquid soap. You can apply CHG directly to the skin and wash gently with a scrungie or a clean  washcloth.   5. Apply the CHG Soap to your body ONLY FROM THE NECK DOWN.  Do not use on open wounds or open sores. Avoid contact with your eyes, ears, mouth and genitals (private parts). Wash Face and genitals (private parts)  with your normal soap.   6. Wash thoroughly, paying special attention to the area where your surgery will be performed.  7. Thoroughly rinse your body with warm water from the neck down.  8. DO NOT shower/wash with your normal soap after using and rinsing off the CHG Soap.  9. Pat yourself dry with a CLEAN TOWEL.  10. Wear CLEAN PAJAMAS to bed  the night before surgery, wear comfortable clothes the morning of surgery  11. Place CLEAN SHEETS on your bed the night of your first shower and DO NOT SLEEP WITH PETS.  Day of Surgery:  Do not apply any deodorants/lotions. Please shower the morning of surgery with the CHG soap  Please wear clean clothes to the hospital/surgery center.   Remember to brush your teeth WITH YOUR REGULAR TOOTHPASTE.   Please read over the fact sheets that you were given.

## 2019-04-04 ENCOUNTER — Other Ambulatory Visit: Payer: Self-pay

## 2019-04-04 ENCOUNTER — Encounter (HOSPITAL_COMMUNITY)
Admission: RE | Admit: 2019-04-04 | Discharge: 2019-04-04 | Disposition: A | Discharge status: Home / Self Care | Payer: BC Managed Care – PPO | Source: Ambulatory Visit | Attending: Neurosurgery | Admitting: Neurosurgery

## 2019-04-04 ENCOUNTER — Encounter (HOSPITAL_COMMUNITY): Payer: Self-pay

## 2019-04-04 DIAGNOSIS — Z01812 Encounter for preprocedural laboratory examination: Secondary | ICD-10-CM | POA: Insufficient documentation

## 2019-04-04 HISTORY — DX: Hypothyroidism, unspecified: E03.9

## 2019-04-04 HISTORY — DX: Headache, unspecified: R51.9

## 2019-04-04 LAB — CBC
HCT: 44.7 % (ref 36.0–46.0)
Hemoglobin: 14.3 g/dL (ref 12.0–15.0)
MCH: 29.1 pg (ref 26.0–34.0)
MCHC: 32 g/dL (ref 30.0–36.0)
MCV: 91 fL (ref 80.0–100.0)
Platelets: 206 10*3/uL (ref 150–400)
RBC: 4.91 MIL/uL (ref 3.87–5.11)
RDW: 12.3 % (ref 11.5–15.5)
WBC: 6.3 10*3/uL (ref 4.0–10.5)
nRBC: 0 % (ref 0.0–0.2)

## 2019-04-04 LAB — BASIC METABOLIC PANEL
Anion gap: 12 (ref 5–15)
BUN: 13 mg/dL (ref 6–20)
CO2: 20 mmol/L — ABNORMAL LOW (ref 22–32)
Calcium: 8.7 mg/dL — ABNORMAL LOW (ref 8.9–10.3)
Chloride: 108 mmol/L (ref 98–111)
Creatinine, Ser: 0.66 mg/dL (ref 0.44–1.00)
GFR calc Af Amer: >60 mL/min (ref >60)
GFR calc non Af Amer: >60 mL/min (ref >60)
Glucose, Bld: 148 mg/dL — ABNORMAL HIGH (ref 70–99)
Potassium: 3.7 mmol/L (ref 3.5–5.1)
Sodium: 140 mmol/L (ref 135–145)

## 2019-04-04 LAB — TYPE AND SCREEN
ABO/RH(D): O POS
Antibody Screen: NEGATIVE

## 2019-04-04 LAB — SURGICAL PCR SCREEN
MRSA, PCR: NEGATIVE
Staphylococcus aureus: NEGATIVE

## 2019-04-04 NOTE — Progress Notes (Signed)
  Coronavirus Screening COVID test scheduled on 04/10/19 at [facility] Have you experienced the following symptoms:  Cough yes/no: No Fever (>100.61F)  yes/no: No Runny nose yes/no: No Sore throat yes/no: No Difficulty breathing/shortness of breath  yes/no: No Have you or a family member traveled in the last 14 days and where? yes/no: No  PCP - [facility]  Cardiologist - denies  Chest x-ray - NA  EKG - NA  Stress Test - denies  ECHO - denies  Cardiac Cath - denies  AICD-denies PM-denies LOOP-denies  Sleep Study - denies CPAP - NA  LABS-CBC,BMP,T/S  ASA-denies  ERAS-NA  HA1C-denies Fasting Blood Sugar -  Checks Blood Sugar _o____ times a day  Anesthesia-N  Pt denies having chest pain, sob, or fever at this time. All instructions explained to the pt, with a verbal understanding of the material. Pt agrees to go over the instructions while at home for a better understanding. Pt also instructed to self quarantine after being tested for COVID-19. The opportunity to ask questions was provided.

## 2019-04-05 LAB — ABO/RH: ABO/RH(D): O POS

## 2019-04-10 ENCOUNTER — Other Ambulatory Visit
Admission: RE | Admit: 2019-04-10 | Discharge: 2019-04-10 | Disposition: A | Discharge status: Home / Self Care | Payer: BC Managed Care – PPO | Source: Ambulatory Visit | Attending: Neurosurgery | Admitting: Neurosurgery

## 2019-04-10 ENCOUNTER — Other Ambulatory Visit: Payer: Self-pay

## 2019-04-10 DIAGNOSIS — Z1159 Encounter for screening for other viral diseases: Secondary | ICD-10-CM | POA: Diagnosis present

## 2019-04-11 ENCOUNTER — Encounter (HOSPITAL_COMMUNITY): Payer: Self-pay | Admitting: Anesthesiology

## 2019-04-11 LAB — SARS CORONAVIRUS 2 (TAT 6-24 HRS): SARS Coronavirus 2: NEGATIVE

## 2019-04-12 ENCOUNTER — Inpatient Hospital Stay (HOSPITAL_COMMUNITY): Payer: BC Managed Care – PPO

## 2019-04-12 ENCOUNTER — Inpatient Hospital Stay (HOSPITAL_COMMUNITY): Payer: BC Managed Care – PPO | Admitting: Anesthesiology

## 2019-04-12 ENCOUNTER — Other Ambulatory Visit: Payer: Self-pay

## 2019-04-12 ENCOUNTER — Inpatient Hospital Stay (HOSPITAL_COMMUNITY)
Admission: RE | Admit: 2019-04-12 | Discharge: 2019-04-14 | DRG: 454 | Disposition: A | Discharge status: Home / Self Care | Payer: BC Managed Care – PPO | Attending: Neurosurgery | Admitting: Neurosurgery

## 2019-04-12 ENCOUNTER — Encounter (HOSPITAL_COMMUNITY): Payer: Self-pay

## 2019-04-12 ENCOUNTER — Encounter (HOSPITAL_COMMUNITY)
Admission: RE | Disposition: A | Discharge status: Home / Self Care | Payer: Self-pay | Source: Home / Self Care | Attending: Neurosurgery

## 2019-04-12 DIAGNOSIS — M532X7 Spinal instabilities, lumbosacral region: Secondary | ICD-10-CM | POA: Diagnosis not present

## 2019-04-12 DIAGNOSIS — M5116 Intervertebral disc disorders with radiculopathy, lumbar region: Secondary | ICD-10-CM | POA: Diagnosis present

## 2019-04-12 DIAGNOSIS — M5117 Intervertebral disc disorders with radiculopathy, lumbosacral region: Secondary | ICD-10-CM | POA: Diagnosis not present

## 2019-04-12 DIAGNOSIS — Z6841 Body Mass Index (BMI) 40.0 and over, adult: Secondary | ICD-10-CM

## 2019-04-12 DIAGNOSIS — Z79899 Other long term (current) drug therapy: Secondary | ICD-10-CM | POA: Diagnosis not present

## 2019-04-12 DIAGNOSIS — M48061 Spinal stenosis, lumbar region without neurogenic claudication: Secondary | ICD-10-CM | POA: Diagnosis present

## 2019-04-12 DIAGNOSIS — M4807 Spinal stenosis, lumbosacral region: Secondary | ICD-10-CM | POA: Diagnosis not present

## 2019-04-12 DIAGNOSIS — Z419 Encounter for procedure for purposes other than remedying health state, unspecified: Secondary | ICD-10-CM

## 2019-04-12 DIAGNOSIS — E039 Hypothyroidism, unspecified: Secondary | ICD-10-CM | POA: Diagnosis present

## 2019-04-12 DIAGNOSIS — M4317 Spondylolisthesis, lumbosacral region: Secondary | ICD-10-CM | POA: Diagnosis present

## 2019-04-12 DIAGNOSIS — Z79890 Hormone replacement therapy (postmenopausal): Secondary | ICD-10-CM | POA: Diagnosis not present

## 2019-04-12 HISTORY — PX: LAMINECTOMY WITH POSTERIOR LATERAL ARTHRODESIS LEVEL 2: SHX6336

## 2019-04-12 HISTORY — PX: ABDOMINAL EXPOSURE: SHX5708

## 2019-04-12 HISTORY — PX: ANTERIOR LUMBAR FUSION: SHX1170

## 2019-04-12 LAB — POCT PREGNANCY, URINE: Preg Test, Ur: NEGATIVE

## 2019-04-12 SURGERY — ANTERIOR LUMBAR FUSION 1 LEVEL
Anesthesia: General

## 2019-04-12 MED ORDER — ROCURONIUM BROMIDE 10 MG/ML (PF) SYRINGE
PREFILLED_SYRINGE | INTRAVENOUS | Status: AC
  Filled 2019-04-12: qty 20

## 2019-04-12 MED ORDER — CEFAZOLIN SODIUM-DEXTROSE 2-4 GM/100ML-% IV SOLN
2.0000 g | Freq: Three times a day (TID) | INTRAVENOUS | Status: AC
  Administered 2019-04-12 (×2): 2 g via INTRAVENOUS
  Filled 2019-04-12 (×2): qty 100

## 2019-04-12 MED ORDER — CHLORHEXIDINE GLUCONATE 4 % EX LIQD: 60.0000 mL | Freq: Once | CUTANEOUS | Status: DC

## 2019-04-12 MED ORDER — HYDROXYZINE HCL 50 MG/ML IM SOLN
50.0000 mg | Freq: Four times a day (QID) | INTRAMUSCULAR | Status: DC | PRN
  Administered 2019-04-12: 50 mg via INTRAMUSCULAR
  Filled 2019-04-12: qty 1

## 2019-04-12 MED ORDER — ONDANSETRON HCL 4 MG/2ML IJ SOLN
4.0000 mg | Freq: Four times a day (QID) | INTRAMUSCULAR | Status: AC | PRN
  Administered 2019-04-12: 4 mg via INTRAVENOUS

## 2019-04-12 MED ORDER — SODIUM CHLORIDE 0.9% FLUSH
3.0000 mL | Freq: Two times a day (BID) | INTRAVENOUS | Status: DC
  Administered 2019-04-13: 3 mL via INTRAVENOUS

## 2019-04-12 MED ORDER — ONDANSETRON HCL 4 MG/2ML IJ SOLN
INTRAMUSCULAR | Status: AC
  Filled 2019-04-12: qty 2

## 2019-04-12 MED ORDER — THROMBIN 20000 UNITS EX SOLR
CUTANEOUS | Status: AC
  Filled 2019-04-12: qty 20000

## 2019-04-12 MED ORDER — SODIUM CHLORIDE 0.9 % IV SOLN: 250.0000 mL | INTRAVENOUS | Status: DC

## 2019-04-12 MED ORDER — SUGAMMADEX SODIUM 200 MG/2ML IV SOLN
INTRAVENOUS | Status: DC | PRN
  Administered 2019-04-12: 300 mg via INTRAVENOUS

## 2019-04-12 MED ORDER — ROCURONIUM BROMIDE 10 MG/ML (PF) SYRINGE
PREFILLED_SYRINGE | INTRAVENOUS | Status: DC | PRN
  Administered 2019-04-12: 50 mg via INTRAVENOUS
  Administered 2019-04-12 (×4): 20 mg via INTRAVENOUS

## 2019-04-12 MED ORDER — DEXAMETHASONE SODIUM PHOSPHATE 10 MG/ML IJ SOLN
10.0000 mg | INTRAMUSCULAR | Status: AC
  Administered 2019-04-12: 08:00:00 10 mg via INTRAVENOUS
  Filled 2019-04-12: qty 1

## 2019-04-12 MED ORDER — MIDAZOLAM HCL 5 MG/5ML IJ SOLN
INTRAMUSCULAR | Status: DC | PRN
  Administered 2019-04-12: 2 mg via INTRAVENOUS

## 2019-04-12 MED ORDER — ONDANSETRON HCL 4 MG/2ML IJ SOLN: 4.0000 mg | Freq: Four times a day (QID) | INTRAMUSCULAR | Status: DC | PRN

## 2019-04-12 MED ORDER — FENTANYL CITRATE (PF) 250 MCG/5ML IJ SOLN
INTRAMUSCULAR | Status: DC | PRN
  Administered 2019-04-12: 50 ug via INTRAVENOUS
  Administered 2019-04-12: 100 ug via INTRAVENOUS
  Administered 2019-04-12 (×6): 50 ug via INTRAVENOUS

## 2019-04-12 MED ORDER — LACTATED RINGERS IV SOLN
INTRAVENOUS | Status: DC | PRN
  Administered 2019-04-12 (×2): via INTRAVENOUS

## 2019-04-12 MED ORDER — FENTANYL CITRATE (PF) 250 MCG/5ML IJ SOLN
INTRAMUSCULAR | Status: AC
  Filled 2019-04-12: qty 5

## 2019-04-12 MED ORDER — BUPIVACAINE LIPOSOME 1.3 % IJ SUSP
20.0000 mL | Freq: Once | INTRAMUSCULAR | Status: DC
  Filled 2019-04-12: qty 20

## 2019-04-12 MED ORDER — BUPIVACAINE HCL (PF) 0.25 % IJ SOLN
INTRAMUSCULAR | Status: AC
  Filled 2019-04-12: qty 30

## 2019-04-12 MED ORDER — OXYCODONE HCL 5 MG PO TABS
5.0000 mg | ORAL_TABLET | Freq: Once | ORAL | Status: AC | PRN
  Administered 2019-04-12: 5 mg via ORAL

## 2019-04-12 MED ORDER — CHLORHEXIDINE GLUCONATE CLOTH 2 % EX PADS: 6.0000 | MEDICATED_PAD | Freq: Once | CUTANEOUS | Status: DC

## 2019-04-12 MED ORDER — LIDOCAINE-EPINEPHRINE 1 %-1:100000 IJ SOLN
INTRAMUSCULAR | Status: DC | PRN
  Administered 2019-04-12: 10 mL

## 2019-04-12 MED ORDER — MIDAZOLAM HCL 2 MG/2ML IJ SOLN
INTRAMUSCULAR | Status: AC
  Filled 2019-04-12: qty 2

## 2019-04-12 MED ORDER — PROPOFOL 10 MG/ML IV BOLUS
INTRAVENOUS | Status: DC | PRN
  Administered 2019-04-12: 180 mg via INTRAVENOUS

## 2019-04-12 MED ORDER — THROMBIN 5000 UNITS EX SOLR
OROMUCOSAL | Status: DC | PRN
  Administered 2019-04-12: 08:00:00 5 mL via TOPICAL

## 2019-04-12 MED ORDER — PHENYLEPHRINE 40 MCG/ML (10ML) SYRINGE FOR IV PUSH (FOR BLOOD PRESSURE SUPPORT)
PREFILLED_SYRINGE | INTRAVENOUS | Status: AC
  Filled 2019-04-12: qty 10

## 2019-04-12 MED ORDER — ALUM & MAG HYDROXIDE-SIMETH 200-200-20 MG/5ML PO SUSP: 30.0000 mL | Freq: Four times a day (QID) | ORAL | Status: DC | PRN

## 2019-04-12 MED ORDER — LIDOCAINE 2% (20 MG/ML) 5 ML SYRINGE
INTRAMUSCULAR | Status: AC
  Filled 2019-04-12: qty 5

## 2019-04-12 MED ORDER — CEFAZOLIN SODIUM-DEXTROSE 2-4 GM/100ML-% IV SOLN
2.0000 g | INTRAVENOUS | Status: AC
  Administered 2019-04-12 (×2): 2 g via INTRAVENOUS
  Filled 2019-04-12: qty 100

## 2019-04-12 MED ORDER — FENTANYL CITRATE (PF) 100 MCG/2ML IJ SOLN
25.0000 ug | INTRAMUSCULAR | Status: DC | PRN
  Administered 2019-04-12 (×4): 25 ug via INTRAVENOUS

## 2019-04-12 MED ORDER — OXYBUTYNIN CHLORIDE ER 10 MG PO TB24
10.0000 mg | ORAL_TABLET | Freq: Every day | ORAL | Status: DC
  Administered 2019-04-13 – 2019-04-14 (×2): 10 mg via ORAL
  Filled 2019-04-12 (×2): qty 1

## 2019-04-12 MED ORDER — CYCLOBENZAPRINE HCL 10 MG PO TABS
10.0000 mg | ORAL_TABLET | Freq: Three times a day (TID) | ORAL | Status: DC | PRN
  Administered 2019-04-12 – 2019-04-13 (×3): 10 mg via ORAL
  Filled 2019-04-12 (×3): qty 1

## 2019-04-12 MED ORDER — LEVOTHYROXINE SODIUM 25 MCG PO TABS
50.0000 ug | ORAL_TABLET | Freq: Every day | ORAL | Status: DC
  Administered 2019-04-13 – 2019-04-14 (×2): 50 ug via ORAL
  Filled 2019-04-12 (×2): qty 2

## 2019-04-12 MED ORDER — FENTANYL CITRATE (PF) 100 MCG/2ML IJ SOLN
INTRAMUSCULAR | Status: AC
  Filled 2019-04-12: qty 2

## 2019-04-12 MED ORDER — SODIUM CHLORIDE 0.9% FLUSH: 3.0000 mL | INTRAVENOUS | Status: DC | PRN

## 2019-04-12 MED ORDER — PHENOL 1.4 % MT LIQD: 1.0000 | OROMUCOSAL | Status: DC | PRN

## 2019-04-12 MED ORDER — ACETAMINOPHEN 650 MG RE SUPP: 650.0000 mg | RECTAL | Status: DC | PRN

## 2019-04-12 MED ORDER — OXYCODONE HCL 5 MG/5ML PO SOLN: 5.0000 mg | Freq: Once | ORAL | Status: AC | PRN

## 2019-04-12 MED ORDER — ONDANSETRON HCL 4 MG PO TABS: 4.0000 mg | ORAL_TABLET | Freq: Four times a day (QID) | ORAL | Status: DC | PRN

## 2019-04-12 MED ORDER — PROPOFOL 10 MG/ML IV BOLUS
INTRAVENOUS | Status: AC
  Filled 2019-04-12: qty 20

## 2019-04-12 MED ORDER — THROMBIN 20000 UNITS EX SOLR
CUTANEOUS | Status: DC | PRN
  Administered 2019-04-12: 08:00:00 20 mL via TOPICAL

## 2019-04-12 MED ORDER — OXYCODONE HCL 5 MG PO TABS
10.0000 mg | ORAL_TABLET | ORAL | Status: DC | PRN
  Administered 2019-04-12 – 2019-04-13 (×4): 10 mg via ORAL
  Filled 2019-04-12 (×4): qty 2

## 2019-04-12 MED ORDER — DEXAMETHASONE SODIUM PHOSPHATE 10 MG/ML IJ SOLN
INTRAMUSCULAR | Status: AC
  Filled 2019-04-12: qty 1

## 2019-04-12 MED ORDER — HYDROMORPHONE HCL 1 MG/ML IJ SOLN
0.5000 mg | INTRAMUSCULAR | Status: DC | PRN
  Administered 2019-04-12: 0.5 mg via INTRAVENOUS
  Filled 2019-04-12: qty 0.5

## 2019-04-12 MED ORDER — LIDOCAINE 2% (20 MG/ML) 5 ML SYRINGE
INTRAMUSCULAR | Status: DC | PRN
  Administered 2019-04-12: 60 mg via INTRAVENOUS

## 2019-04-12 MED ORDER — MENTHOL 3 MG MT LOZG: 1.0000 | LOZENGE | OROMUCOSAL | Status: DC | PRN

## 2019-04-12 MED ORDER — ACETAMINOPHEN 325 MG PO TABS
650.0000 mg | ORAL_TABLET | ORAL | Status: DC | PRN
  Administered 2019-04-12 – 2019-04-13 (×3): 650 mg via ORAL
  Filled 2019-04-12 (×4): qty 2

## 2019-04-12 MED ORDER — THROMBIN 5000 UNITS EX SOLR
CUTANEOUS | Status: AC
  Filled 2019-04-12: qty 5000

## 2019-04-12 MED ORDER — CEFAZOLIN SODIUM-DEXTROSE 2-4 GM/100ML-% IV SOLN
INTRAVENOUS | Status: AC
  Filled 2019-04-12: qty 100

## 2019-04-12 MED ORDER — ASPIRIN-ACETAMINOPHEN-CAFFEINE 250-250-65 MG PO TABS
1.0000 | ORAL_TABLET | Freq: Four times a day (QID) | ORAL | Status: DC | PRN
  Filled 2019-04-12: qty 1

## 2019-04-12 MED ORDER — ONDANSETRON HCL 4 MG/2ML IJ SOLN
INTRAMUSCULAR | Status: DC | PRN
  Administered 2019-04-12: 4 mg via INTRAVENOUS

## 2019-04-12 MED ORDER — LACTATED RINGERS IV SOLN
INTRAVENOUS | Status: DC | PRN
  Administered 2019-04-12: 08:00:00 via INTRAVENOUS

## 2019-04-12 MED ORDER — PANTOPRAZOLE SODIUM 40 MG IV SOLR
40.0000 mg | Freq: Every day | INTRAVENOUS | Status: DC
  Administered 2019-04-12: 40 mg via INTRAVENOUS
  Filled 2019-04-12: qty 40

## 2019-04-12 MED ORDER — 0.9 % SODIUM CHLORIDE (POUR BTL) OPTIME
TOPICAL | Status: DC | PRN
  Administered 2019-04-12: 08:00:00 1000 mL

## 2019-04-12 MED ORDER — BUPIVACAINE LIPOSOME 1.3 % IJ SUSP
INTRAMUSCULAR | Status: DC | PRN
  Administered 2019-04-12: 20 mL

## 2019-04-12 MED ORDER — OXYCODONE HCL 5 MG PO TABS
ORAL_TABLET | ORAL | Status: AC
  Filled 2019-04-12: qty 1

## 2019-04-12 MED ORDER — PHENYLEPHRINE 40 MCG/ML (10ML) SYRINGE FOR IV PUSH (FOR BLOOD PRESSURE SUPPORT)
PREFILLED_SYRINGE | INTRAVENOUS | Status: DC | PRN
  Administered 2019-04-12: 80 ug via INTRAVENOUS
  Administered 2019-04-12 (×2): 40 ug via INTRAVENOUS

## 2019-04-12 MED ORDER — PREGABALIN 50 MG PO CAPS
50.0000 mg | ORAL_CAPSULE | Freq: Two times a day (BID) | ORAL | Status: DC
  Administered 2019-04-12 – 2019-04-14 (×4): 50 mg via ORAL
  Filled 2019-04-12 (×4): qty 1

## 2019-04-12 MED ORDER — LIDOCAINE-EPINEPHRINE 1 %-1:100000 IJ SOLN
INTRAMUSCULAR | Status: AC
  Filled 2019-04-12: qty 1

## 2019-04-12 MED ORDER — SODIUM CHLORIDE 0.9 % IV SOLN
INTRAVENOUS | Status: DC | PRN
  Administered 2019-04-12 (×2): 500 mL

## 2019-04-12 SURGICAL SUPPLY — 101 items
ANCHOR LUMBAR 27 (Anchor) ×3 IMPLANT
ANCHOR LUMBAR 27MM (Anchor) ×3 IMPLANT
APPLIER CLIP 11 MED OPEN (CLIP) ×3
BAG DECANTER FOR FLEXI CONT (MISCELLANEOUS) ×5 IMPLANT
BASKET BONE COLLECTION (BASKET) ×2 IMPLANT
BENZOIN TINCTURE PRP APPL 2/3 (GAUZE/BANDAGES/DRESSINGS) ×4 IMPLANT
BONE MATRIX OSTEOCEL PRO MED (Bone Implant) ×2 IMPLANT
BONE VIVIGEN FORMABLE 5.4CC (Bone Implant) ×6 IMPLANT
BUR CUTTER 7.0 ROUND (BURR) ×2 IMPLANT
BUR MATCHSTICK NEURO 3.0 LAGG (BURR) ×2 IMPLANT
CANISTER SUCT 3000ML PPV (MISCELLANEOUS) ×3 IMPLANT
CAP LOCKING THREADED (Cap) ×12 IMPLANT
CARTRIDGE OIL MAESTRO DRILL (MISCELLANEOUS) ×1 IMPLANT
CLIP APPLIE 11 MED OPEN (CLIP) ×2 IMPLANT
CLIP LIGATING EXTRA MED SLVR (CLIP) ×3 IMPLANT
CLIP LIGATING EXTRA SM BLUE (MISCELLANEOUS) ×3 IMPLANT
CLOSURE WOUND 1/2 X4 (GAUZE/BANDAGES/DRESSINGS) ×2
COVER MAYO STAND STRL (DRAPES) ×2 IMPLANT
COVER WAND RF STERILE (DRAPES) ×2 IMPLANT
DERMABOND ADVANCED (GAUZE/BANDAGES/DRESSINGS) ×2
DERMABOND ADVANCED .7 DNX12 (GAUZE/BANDAGES/DRESSINGS) ×1 IMPLANT
DIFFUSER DRILL AIR PNEUMATIC (MISCELLANEOUS) ×3 IMPLANT
DRAPE C-ARM 42X72 X-RAY (DRAPES) ×7 IMPLANT
DRAPE C-ARMOR (DRAPES) ×4 IMPLANT
DRAPE LAPAROTOMY 100X72X124 (DRAPES) ×5 IMPLANT
DRSG OPSITE POSTOP 4X10 (GAUZE/BANDAGES/DRESSINGS) ×2 IMPLANT
DRSG OPSITE POSTOP 4X8 (GAUZE/BANDAGES/DRESSINGS) ×2 IMPLANT
ELECT BLADE 4.0 EZ CLEAN MEGAD (MISCELLANEOUS) ×3
ELECT BLADE 6.5 EXT (BLADE) ×5 IMPLANT
ELECT REM PT RETURN 9FT ADLT (ELECTROSURGICAL) ×6
ELECTRODE BLDE 4.0 EZ CLN MEGD (MISCELLANEOUS) ×1 IMPLANT
ELECTRODE REM PT RTRN 9FT ADLT (ELECTROSURGICAL) ×1 IMPLANT
EVACUATOR 1/8 PVC DRAIN (DRAIN) ×2 IMPLANT
GAUZE 4X4 16PLY RFD (DISPOSABLE) ×2 IMPLANT
GAUZE SPONGE 4X4 12PLY STRL (GAUZE/BANDAGES/DRESSINGS) ×3 IMPLANT
GLOVE BIO SURGEON STRL SZ7 (GLOVE) IMPLANT
GLOVE BIO SURGEON STRL SZ8 (GLOVE) ×3 IMPLANT
GLOVE BIOGEL PI IND STRL 7.0 (GLOVE) IMPLANT
GLOVE BIOGEL PI INDICATOR 7.0 (GLOVE)
GLOVE EXAM NITRILE XL STR (GLOVE) IMPLANT
GLOVE INDICATOR 8.5 STRL (GLOVE) ×3 IMPLANT
GLOVE SS BIOGEL STRL SZ 7.5 (GLOVE) ×1 IMPLANT
GLOVE SUPERSENSE BIOGEL SZ 7.5 (GLOVE) ×2
GOWN STRL REUS W/ TWL LRG LVL3 (GOWN DISPOSABLE) ×1 IMPLANT
GOWN STRL REUS W/ TWL XL LVL3 (GOWN DISPOSABLE) ×1 IMPLANT
GOWN STRL REUS W/TWL 2XL LVL3 (GOWN DISPOSABLE) ×3 IMPLANT
GOWN STRL REUS W/TWL LRG LVL3 (GOWN DISPOSABLE) ×2
GOWN STRL REUS W/TWL XL LVL3 (GOWN DISPOSABLE) ×2
GRAFT BNE MATRIX VG FRMBL MD 5 (Bone Implant) IMPLANT
HEMOSTAT POWDER KIT SURGIFOAM (HEMOSTASIS) ×2 IMPLANT
INSERT FOGARTY 61MM (MISCELLANEOUS) IMPLANT
INSERT FOGARTY SM (MISCELLANEOUS) IMPLANT
KIT BASIN OR (CUSTOM PROCEDURE TRAY) ×3 IMPLANT
LOOP VESSEL MAXI BLUE (MISCELLANEOUS) IMPLANT
LOOP VESSEL MINI RED (MISCELLANEOUS) IMPLANT
NDL HYPO 21X1.5 SAFETY (NEEDLE) IMPLANT
NDL SPNL 18GX3.5 QUINCKE PK (NEEDLE) ×1 IMPLANT
NEEDLE HYPO 21X1.5 SAFETY (NEEDLE) ×3 IMPLANT
NEEDLE SPNL 18GX3.5 QUINCKE PK (NEEDLE) IMPLANT
NS IRRIG 1000ML POUR BTL (IV SOLUTION) ×7 IMPLANT
OIL CARTRIDGE MAESTRO DRILL (MISCELLANEOUS) ×3
PACK LAMINECTOMY NEURO (CUSTOM PROCEDURE TRAY) ×5 IMPLANT
PAD ARMBOARD 7.5X6 YLW CONV (MISCELLANEOUS) ×10 IMPLANT
ROD 65MM SPINAL (Rod) ×4 IMPLANT
ROD SPNL 5.5 CREO TI 65 (Rod) IMPLANT
SCREW AMP MODULAR CREO 6.5X45 (Screw) ×4 IMPLANT
SCREW CREO 6.5X40 (Screw) ×8 IMPLANT
SCREW PA THRD CREO TULIP 5.5X4 (Head) ×14 IMPLANT
SPACER HEDRON IA 26X34X15 15D (Spacer) ×2 IMPLANT
SPACER SUSTAIN TI 9X22X12 15D (Spacer) ×4 IMPLANT
SPONGE INTESTINAL PEANUT (DISPOSABLE) ×6 IMPLANT
SPONGE LAP 18X18 RF (DISPOSABLE) ×2 IMPLANT
SPONGE LAP 4X18 RFD (DISPOSABLE) IMPLANT
SPONGE SURGIFOAM ABS GEL 100 (HEMOSTASIS) ×4 IMPLANT
STAPLER VISISTAT 35W (STAPLE) ×1 IMPLANT
STRIP CLOSURE SKIN 1/2X4 (GAUZE/BANDAGES/DRESSINGS) ×2 IMPLANT
SUT PDS AB 1 CTX 36 (SUTURE) ×2 IMPLANT
SUT PROLENE 4 0 RB 1 (SUTURE)
SUT PROLENE 4-0 RB1 .5 CRCL 36 (SUTURE) IMPLANT
SUT PROLENE 5 0 CC1 (SUTURE) IMPLANT
SUT PROLENE 6 0 C 1 30 (SUTURE) ×1 IMPLANT
SUT PROLENE 6 0 CC (SUTURE) IMPLANT
SUT SILK 0 TIES 10X30 (SUTURE) ×1 IMPLANT
SUT SILK 2 0 TIES 10X30 (SUTURE) ×3 IMPLANT
SUT SILK 2 0SH CR/8 30 (SUTURE) IMPLANT
SUT SILK 3 0 SH CR/8 (SUTURE) IMPLANT
SUT SILK 3 0 TIES 10X30 (SUTURE) ×1 IMPLANT
SUT SILK 3 0SH CR/8 30 (SUTURE) IMPLANT
SUT VIC AB 0 CT1 18XCR BRD8 (SUTURE) ×1 IMPLANT
SUT VIC AB 0 CT1 27 (SUTURE)
SUT VIC AB 0 CT1 27XBRD ANBCTR (SUTURE) ×2 IMPLANT
SUT VIC AB 0 CT1 27XBRD ANTBC (SUTURE) IMPLANT
SUT VIC AB 0 CT1 8-18 (SUTURE) ×2
SUT VIC AB 2-0 CT1 18 (SUTURE) ×7 IMPLANT
SUT VIC AB 4-0 PS2 27 (SUTURE) ×5 IMPLANT
SYR 20CC LL (SYRINGE) ×2 IMPLANT
SYR CONTROL 10ML LL (SYRINGE) ×2 IMPLANT
TOWEL GREEN STERILE (TOWEL DISPOSABLE) ×6 IMPLANT
TOWEL GREEN STERILE FF (TOWEL DISPOSABLE) ×3 IMPLANT
TRAY FOLEY MTR SLVR 16FR STAT (SET/KITS/TRAYS/PACK) ×3 IMPLANT
WATER STERILE IRR 1000ML POUR (IV SOLUTION) ×3 IMPLANT

## 2019-04-12 NOTE — Anesthesia Procedure Notes (Signed)
Procedure Name: Intubation Date/Time: 04/12/2019 7:37 AM Performed by: Wilburn Cornelia, CRNA Pre-anesthesia Checklist: Patient identified, Emergency Drugs available, Suction available, Timeout performed and Patient being monitored Patient Re-evaluated:Patient Re-evaluated prior to induction Oxygen Delivery Method: Circle system utilized Preoxygenation: Pre-oxygenation with 100% oxygen Induction Type: IV induction Ventilation: Mask ventilation without difficulty Laryngoscope Size: Mac and 3 Grade View: Grade III Tube type: Oral Tube size: 7.0 mm Number of attempts: 2 Airway Equipment and Method: Stylet Placement Confirmation: ETT inserted through vocal cords under direct vision,  positive ETCO2,  CO2 detector and breath sounds checked- equal and bilateral Secured at: 21 cm Tube secured with: Tape Dental Injury: Teeth and Oropharynx as per pre-operative assessment

## 2019-04-12 NOTE — Transfer of Care (Signed)
Immediate Anesthesia Transfer of Care Note  Patient: Jody Walls  Procedure(s) Performed: Lumbar Five-Sacral One Anterior Lumbar Interbody Fusion (N/A ) Lumbar Four-Sacral One Posterior Lateral Fusion (N/A ) ABDOMINAL EXPOSURE (N/A )  Patient Location: PACU  Anesthesia Type:General  Level of Consciousness: awake, alert  and oriented  Airway & Oxygen Therapy: Patient Spontanous Breathing and Patient connected to nasal cannula oxygen  Post-op Assessment: Report given to RN and Post -op Vital signs reviewed and stable  Post vital signs: Reviewed and stable  Last Vitals:  Vitals Value Taken Time  BP 113/79 04/12/19 1353  Temp 36.7 C 04/12/19 1353  Pulse 97 04/12/19 1356  Resp 8 04/12/19 1356  SpO2 100 % 04/12/19 1356  Vitals shown include unvalidated device data.  Last Pain:  Vitals:   04/12/19 0601  TempSrc:   PainSc: 3       Patients Stated Pain Goal: 2 ([id number])  Complications: No apparent anesthesia complications

## 2019-04-12 NOTE — H&P (Signed)
Jody Walls is an 38 y.o. female.   Chief Complaint: Back and left greater than right leg pain HPI: 38 year old female with longstanding back and bilateral leg pain worse on the left work-up revealed a grade 1 spondylolisthesis at L5-S1 severe stenosis central disc herniation L4-5.  Due to patient's progression of clinical syndrome imaging findings failed conservative treatment I recommended anterior lumbar interbody fusion L5-S1 and posterior lumbar interbody fusion L4-5 with posterior lateral arthrodesis with instrumentation L4-S1.  I extensively gone over the risks and benefits of the procedure with her as well as perioperative course expectations of outcome and alternatives of surgery and she understands and agrees to proceed forward.  Past Medical History:  Diagnosis Date  . Anxiety   . Depression   . Headache   . Hypothyroidism   . Leg pain, posterior    Down to her calf.  . Low back pain   . Spondylolisthesis of lumbar region    With some radiculopathy into her legs    Past Surgical History:  Procedure Laterality Date  . arm fracture Right 2003  . CESAREAN SECTION  2010  . CHOLECYSTECTOMY    . TUBAL LIGATION      Family History  Problem Relation Age of Onset  . COPD Mother   . Cancer Mother   . Heart disease Mother   . Cancer Father   . Drug abuse Brother   . Drug abuse Brother    Social History:  reports that she has never smoked. She has never used smokeless tobacco. She reports current alcohol use. She reports that she does not use drugs.  Allergies:  Allergies  Allergen Reactions  . Shellfish Allergy Anaphylaxis and Hives    Medications Prior to Admission  Medication Sig Dispense Refill  . aspirin-acetaminophen-caffeine (EXCEDRIN MIGRAINE) 250-250-65 MG tablet Take 1 tablet by mouth every 6 (six) hours as needed for headache.    . levothyroxine (SYNTHROID, LEVOTHROID) 50 MCG tablet Take 50 mcg by mouth daily before breakfast.     . meloxicam (MOBIC) 15  MG tablet Take 15 mg by mouth daily.    Marland Kitchen. oxybutynin (DITROPAN-XL) 10 MG 24 hr tablet Take 10 mg by mouth daily.    . pregabalin (LYRICA) 50 MG capsule Take 50 mg by mouth 2 (two) times a day.      Results for orders placed or performed during the hospital encounter of 04/12/19 (from the past 48 hour(s))  Pregnancy, urine POC     Status: None   Collection Time: 04/12/19  6:08 AM  Result Value Ref Range   Preg Test, Ur NEGATIVE NEGATIVE    Comment:        THE SENSITIVITY OF THIS METHODOLOGY IS >24 mIU/mL    No results found.  Review of Systems  Musculoskeletal: Positive for back pain.  Neurological: Positive for tingling and sensory change.    Blood pressure (!) 171/69, pulse 85, temperature 98.4 F (36.9 C), temperature source Oral, resp. rate 18, height 5\' 3"  (1.6 m), weight 112.5 kg, last menstrual period 03/28/2019, SpO2 98 %. Physical Exam  Constitutional: She is oriented to person, place, and time. She appears well-developed.  HENT:  Head: Normocephalic.  Eyes: Pupils are equal, round, and reactive to light.  Neck: Normal range of motion.  Cardiovascular: Normal rate.  Respiratory: Effort normal.  GI: Soft.  Musculoskeletal: Normal range of motion.  Neurological: She is alert and oriented to person, place, and time. She has normal strength. GCS eye subscore is 4.  GCS verbal subscore is 5. GCS motor subscore is 6.  Strength is 5 out of 5 iliopsoas, quads, hamstrings, gastrocs, into tibialis, and EHL.  Skin: Skin is warm and dry.     Assessment/Plan 38 year old female presents for lumbar interbody fusions L4-5 L5-S1  Kason Benak P, MD 04/12/2019, 7:25 AM

## 2019-04-12 NOTE — Op Note (Signed)
Preoperative diagnosis: #1 grade 1 spondylolisthesis L5-S1 with instability and radiculopathy  2.  Lumbar spinal stenosis degenerative disease central disc radiation L4-5 with bilateral L5 radiculopathy  Postoperative diagnosis: Same  Procedure #1 anterior lumbar interbody fusion utilizing the globus titanium cage with integrated shanks with 27 mm shavings placed at L5 and S1 and a 15 mm 15 degree medium size cage packed with osteocel pro  Surgeon Jillyn HiddenGary Rita Prom  Co-surgeon Dr. Tawanna Coolerodd early  Procedure #2 after repositioning prone on the Wilson frame with a separate skin incision posterior lumbar interbody fusion L4-5 utilizing the globus insert and rotate TPS coated peek cages packed with locally harvested autograft mixed with vivigen with decompressive laminectomy L4-5 and radical foraminotomies of the L4 and L5 nerve roots bilaterally  3.  Pedicle screw fixation L4-S1 utilizing the globus Creo amp modular pedicle screw set  4.  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  Due to patient's progression of clinical syndrome imaging findings and failed conservative treatment I recommended decompressive laminectomy and interbody fusion posteriorly at L4-5 and anterior lumbar interbody fusion L5-S1 with pedicle screw augmentation L4-S1.  I extensively went over the risks and benefits of the procedure with her as well as perioperative course expectations of outcome and alternatives of surgery and she understood and agreed to proceed forward.  Operative procedure: The first part of the procedure Dr. Tawanna Coolerodd early will  dictate the abdominal exposure but after the patient was brought in the OR prepped and draped in the abdominal exposure was achieved at L5S1 the space was incised cleaned out endplates were prepared and got down to the posterior annulus and under bit both endplates confirmed positioning with fluoroscopy through sequential sizers I selected a medium 15 mm 15 degree implant with 27 mm thanks oriented with 1 shank at L5-S1.  After adequate discectomy and endplate preparation with sequential distraction with the dilators did help partially reduce the deformity.  Packed the cage with the osteocel pro inserted the cage and deployed the [place]Shanks.  Under fluoroscopy everything was in good position.  The wounds are copious irrigated to Kassim states was maintained postop x-ray initially was read out the possibility of the presence of inferior sponge however this was identified to be a defect in the x-ray machine after multiple correct counts and RF reader that showed no evidence of retained sponge fluoroscopy in addition confirmed no presence of retained sponge this was attributed as an error in defect in the x-ray machine.  Patient was repositioned prone the Wilson frame for the second part of the procedure.  After positioning back was prepped and draped in routine sterile fashion incision was infiltrated with 10 cc lidocaine with epi midline incision was made above the car was used to take down subtenons tissue and subperiosteal dissection carried lamina of L4-L5 and S1 bilaterally.  Identified the appropriate level with fluoroscopy drilled down both facet joints retained the interspinous ligament and spinous process at L4 but performed complete facetectomies around foraminotomies the L4 and L5 nerve root.  There was marked stenosis primarily on the right L5 nerve root from a large spur, the superior aspect the facet joint at L5-S1.  So I  moved partial the facet complex and lamina of L5 on the right and tract the L5 nerve  root distally on his foramen to confirm decompression.  Similar was performed on the left after complete decompression at L4 and L5 aggressive and abutting the superior tickling facets in excess of the lateral margin of the space.  Epidurograms coagulated space was cleaned out endplates were prepared large central disc was removed and after adequate endplate and preparation was achieved and discectomy and with sequential distractors in place I selected 13 mm 15 degree lordotic cages packed with locally harvested autograft mixed and inserted cages packing and aggressive amount of autograft mix centrally.  Then after the cage was placed and confirmed by fluoroscopy attention taken pedicle screw placement in standard fashion pedicle screws were placed at L4-L5 and S1 bilaterally six 5 x 45 set L4 six 5 x 40 is at L5 and S1.  All screws had excellent purchase postop fluoroscopy confirmed good position all implants.  Wounds and copiously irrigated meticulous hemostasis was maintained aggressive decortication was carried out and TPs lateral gutters remainder the locally harvested autograft mix was packed posterior laterally from L5-S1 heads were assembled rods were anchored down the L4 screw was compressed against L5.  Then the foramina reinspected to confirm patency and no migration of graft material Gelfoam was overlaid top of the dura a medium Hemovac drain was placed Exparel was injected in the fascia and the wound was closed in layers with Vicryl.  Skin was closed running 4 subcuticular.  Dermabond benzoin Steri-Strips and sterile dressing was applied patient cover him in stable condition.  At the end the case on needle count sponge counts were correct.

## 2019-04-12 NOTE — Anesthesia Preprocedure Evaluation (Signed)
Anesthesia Evaluation  Patient identified by MRN, date of birth, ID band Patient awake    Reviewed: Allergy & Precautions, H&P , NPO status , Patient's Chart, lab work & pertinent test results  Airway Mallampati: II   Neck ROM: full    Dental   Pulmonary neg pulmonary ROS,    breath sounds clear to auscultation       Cardiovascular negative cardio ROS   Rhythm:regular Rate:Normal     Neuro/Psych  Headaches, PSYCHIATRIC DISORDERS Anxiety Depression  Neuromuscular disease    GI/Hepatic   Endo/Other  Hypothyroidism Morbid obesity  Renal/GU      Musculoskeletal   Abdominal   Peds  Hematology   Anesthesia Other Findings   Reproductive/Obstetrics                             Anesthesia Physical Anesthesia Plan  ASA: II  Anesthesia Plan: General   Post-op Pain Management:    Induction: Intravenous  PONV Risk Score and Plan: 3 and Ondansetron, Dexamethasone, Midazolam, Treatment may vary due to age or medical condition and Scopolamine patch - Pre-op  Airway Management Planned: Oral ETT  Additional Equipment:   Intra-op Plan:   Post-operative Plan: Extubation in OR  Informed Consent: I have reviewed the patients History and Physical, chart, labs and discussed the procedure including the risks, benefits and alternatives for the proposed anesthesia with the patient or authorized representative who has indicated his/her understanding and acceptance.       Plan Discussed with: CRNA, Anesthesiologist and Surgeon  Anesthesia Plan Comments:         Anesthesia Quick Evaluation

## 2019-04-12 NOTE — Op Note (Signed)
    OPERATIVE REPORT  DATE OF SURGERY: 04/12/2019  PATIENT: Jody Walls, 38 y.o. female MRN: [id number]  DOB: 1981-06-19  PRE-OPERATIVE DIAGNOSIS: Degenerative disc disease  POST-OPERATIVE DIAGNOSIS:  Same  PROCEDURE: Anterior exposure for L5-S1 disc fusion  SURGEON:  Curt Jews, M.D.  Co-surgeon for the exposure Dr. Dominica Severin cram  ANESTHESIA: General  EBL: per anesthesia record  Total I/O In: 1000 [I.V.:1000] Out: 175 [Urine:125; Blood:50]  BLOOD ADMINISTERED: none  DRAINS: none  SPECIMEN: none  COUNTS CORRECT:  YES  PATIENT DISPOSITION:  PACU - hemodynamically stable  PROCEDURE DETAILS: Patient was taken the operating placed supine position where the area of the abdomen was prepped draped you sterile fashion.  C arm was brought onto the field and crosstable lateral revealed the level of the L5-S1 disc in relationship to the anterior abdominal wall.  Incision was made from the midline to the left over this level which was just above the pubic tubercle.  The anterior rectus sheath was opened in line with the skin incision and the rectus muscle was mobilized.  The retroperitoneal space was entered bluntly below the level of the semilunar line.  The intraperitoneal contents were mobilized to the right.  The posterior rectus sheath was opened laterally.  Blunt dissection was continued to mobilize intraperitoneal contents and the left ureter to the right.  The arterial venous structures overlying the L5-S1 disc were identified.  Blunt Kitner dissection over the L5-S1 disc was used to mobilize the iliac vessels.  The middle sacral vessels were clipped and divided.  The Thompson retractor was brought onto the field and the reverse lip 200 blades were positioned to the right and left of the L5-S1 disc.  Malleable retractors were used for superior and inferior retraction.  A spinal needle was placed in the L5-S1 disc and C-arm was brought back onto the field to confirm that this was the  appropriate level.  The remainder of the procedure will be dictated as a separate note by Dr. Malena Peer, M.D., [facility] 04/12/2019 10:38 AM

## 2019-04-13 MED ORDER — DEXAMETHASONE SODIUM PHOSPHATE 10 MG/ML IJ SOLN
10.0000 mg | Freq: Four times a day (QID) | INTRAMUSCULAR | Status: AC
  Administered 2019-04-13 – 2019-04-14 (×4): 10 mg via INTRAVENOUS
  Filled 2019-04-13 (×4): qty 1

## 2019-04-13 MED ORDER — CYCLOBENZAPRINE HCL 10 MG PO TABS: 10.0000 mg | ORAL_TABLET | Freq: Three times a day (TID) | ORAL | 0 refills | Status: DC | PRN

## 2019-04-13 MED ORDER — OXYCODONE HCL 5 MG PO TABS
15.0000 mg | ORAL_TABLET | ORAL | Status: DC | PRN
  Administered 2019-04-13 – 2019-04-14 (×7): 15 mg via ORAL
  Filled 2019-04-13 (×7): qty 3

## 2019-04-13 MED ORDER — PANTOPRAZOLE SODIUM 40 MG PO TBEC
40.0000 mg | DELAYED_RELEASE_TABLET | Freq: Two times a day (BID) | ORAL | Status: DC
  Administered 2019-04-13 – 2019-04-14 (×3): 40 mg via ORAL
  Filled 2019-04-13 (×3): qty 1

## 2019-04-13 MED ORDER — OXYCODONE-ACETAMINOPHEN 10-325 MG PO TABS: 1.0000 | ORAL_TABLET | Freq: Four times a day (QID) | ORAL | 0 refills | Status: AC | PRN

## 2019-04-13 MED ORDER — METHOCARBAMOL 500 MG PO TABS
500.0000 mg | ORAL_TABLET | Freq: Four times a day (QID) | ORAL | Status: DC | PRN
  Administered 2019-04-13 – 2019-04-14 (×4): 500 mg via ORAL
  Filled 2019-04-13 (×4): qty 1

## 2019-04-13 MED ORDER — PANTOPRAZOLE SODIUM 40 MG PO TBEC: 40.0000 mg | DELAYED_RELEASE_TABLET | Freq: Every day | ORAL | Status: DC

## 2019-04-13 MED FILL — Sodium Chloride IV Soln 0.9%: INTRAVENOUS | Qty: 1000 | Status: AC

## 2019-04-13 MED FILL — Sodium Chloride Irrigation Soln 0.9%: Qty: 3000 | Status: AC

## 2019-04-13 MED FILL — Heparin Sodium (Porcine) Inj 1000 Unit/ML: INTRAMUSCULAR | Qty: 30 | Status: AC

## 2019-04-13 NOTE — Discharge Instructions (Signed)

## 2019-04-13 NOTE — Progress Notes (Signed)
Patient ID: Jody Walls, female   DOB: November 05, 1980, 38 y.o.   MRN: [id number] Sitting up and eating breakfast.  Had some nausea and vomiting last night.  Resolved this morning.  Reports discomfort in her right buttock and leg.  Mild abdominal soreness. Abdomen soft.  Palpable dorsalis pedis pulses bilaterally. Stable postop day 1.  Will not follow actively.  Please call if we can assist

## 2019-04-13 NOTE — Evaluation (Signed)
Physical Therapy Evaluation Patient Details Name: Jody Walls MRN: [id number] DOB: [birth year] Today's Date: 04/13/2019   History of Present Illness  Pt is a 38 yo female s/p L5-S1 ALIF and PLIF L4-5. Pt PMHx: back pain, anxiety, depression.  Clinical Impression  Patient admitted s/p above listed procedure. Patient reports independence at baseline for mobility and ADLs. Patient today requiring min guard for functional mobility with cueing for safety throughout. Encouraged to use RW for added stability, however patient declining this in session - will likely need to go home with as she required 1 HHA and use of handrail for mobility. Review of back precautions and log rolling when returning to supine with good verbal understanding. PT to follow acutely to progress safe and independent functional mobility prior to return home.   Discussed DME needs - states her husband is planning on getting DME, however she was unsure as to what - will need to follow-up on this.     Follow Up Recommendations Outpatient PT;Follow surgeon's recommendation for DC plan and follow-up therapies    Equipment Recommendations  Rolling walker with 5" wheels;3in1 (PT)    Recommendations for Other Services       Precautions / Restrictions Precautions Precautions: Back Precaution Booklet Issued: Yes (comment) Precaution Comments: verbally discussed handout Required Braces or Orthoses: Spinal Brace Spinal Brace: Lumbar corset Restrictions Weight Bearing Restrictions: No      Mobility  Bed Mobility Overal bed mobility: Needs Assistance Bed Mobility: Rolling;Sit to Sidelying Rolling: Supervision       Sit to sidelying: Supervision General bed mobility comments: increased time for transfers to prevent pain; able to bring B LE into bed without assist  Transfers Overall transfer level: Needs assistance Equipment used: Rolling walker (2 wheeled) Transfers: Sit to/from Stand Sit to Stand: Min guard         General transfer comment: requires minguardA for initial standing balance; uses handrail in restroom to stand from toilet  Ambulation/Gait Ambulation/Gait assistance: Min guard Gait Distance (Feet): 80 Feet Assistive device: 1 person hand held assist(+ handrail in hallway) Gait Pattern/deviations: Step-through pattern;Decreased stride length Gait velocity: decreased   General Gait Details: slow pace of gait due to reports beack discomfort - declined use of RW for mobility, however will benefit from RW at least for a short period as she required 1 HHA and handrail in hallway  Stairs            Wheelchair Mobility    Modified Rankin (Stroke Patients Only)       Balance Overall balance assessment: Needs assistance Sitting-balance support: Bilateral upper extremity supported;Feet supported Sitting balance-Leahy Scale: Fair     Standing balance support: Bilateral upper extremity supported;During functional activity;No upper extremity supported Standing balance-Leahy Scale: Poor                               Pertinent Vitals/Pain Pain Assessment: 0-10 Pain Score: 7  Pain Location: low back, abdomen Pain Descriptors / Indicators: Discomfort;Tightness Pain Intervention(s): Limited activity within patient's tolerance;Monitored during session;Premedicated before session;Repositioned    Home Living Family/patient expects to be discharged to:: Private residence Living Arrangements: Spouse/significant other;Children Available Help at Discharge: Family;Available PRN/intermittently Type of Home: House Home Access: Stairs to enter   Entrance Stairs-Number of Steps: 7 Home Layout: One level Home Equipment: Walker - 2 wheels;Toilet riser      Prior Function Level of Independence: Independent with assistive device(s)  Hand Dominance   Dominant Hand: Right    Extremity/Trunk Assessment   Upper Extremity Assessment Upper Extremity  Assessment: Overall WFL for tasks assessed    Lower Extremity Assessment Lower Extremity Assessment: Generalized weakness    Cervical / Trunk Assessment Cervical / Trunk Assessment: Other exceptions Cervical / Trunk Exceptions: s/p back sx  Communication   Communication: No difficulties  Cognition Arousal/Alertness: Awake/alert Behavior During Therapy: WFL for tasks assessed/performed;Flat affect Overall Cognitive Status: Within Functional Limits for tasks assessed                                        General Comments General comments (skin integrity, edema, etc.): did not want to use RW, however will likely need for stability    Exercises     Assessment/Plan    PT Assessment Patient needs continued PT services  PT Problem List Decreased strength;Decreased activity tolerance;Decreased balance;Decreased mobility;Decreased knowledge of use of DME;Decreased safety awareness       PT Treatment Interventions DME instruction;Gait training;Stair training;Functional mobility training;Therapeutic activities;Therapeutic exercise;Balance training;Patient/family education    PT Goals (Current goals can be found in the Care Plan section)  Acute Rehab PT Goals Patient Stated Goal: to move better without pain PT Goal Formulation: With patient Time For Goal Achievement: 04/27/19 Potential to Achieve Goals: Good    Frequency Min 5X/week   Barriers to discharge        Co-evaluation               AM-PAC PT "6 Clicks" Mobility  Outcome Measure Help needed turning from your back to your side while in a flat bed without using bedrails?: A Little Help needed moving from lying on your back to sitting on the side of a flat bed without using bedrails?: A Little Help needed moving to and from a bed to a chair (including a wheelchair)?: A Little Help needed standing up from a chair using your arms (e.g., wheelchair or bedside chair)?: A Little Help needed to walk in  hospital room?: A Little Help needed climbing 3-5 steps with a railing? : A Lot 6 Click Score: 17    End of Session Equipment Utilized During Treatment: Gait belt;Back brace Activity Tolerance: Patient tolerated treatment well;Patient limited by pain Patient left: in bed;with call bell/phone within reach Nurse Communication: Mobility status PT Visit Diagnosis: Unsteadiness on feet (R26.81);Other abnormalities of gait and mobility (R26.89);Muscle weakness (generalized) (M62.81);Pain Pain - part of body: (back)    Time: [id number] PT Time Calculation (min) (ACUTE ONLY): 23 min   Charges:   PT Evaluation $PT Eval Moderate Complexity: 1 Mod PT Treatments $Gait Training: 8-22 mins        Kipp LaurenceStephanie R Brooklyn Jeff, PT, DPT Supplemental Physical Therapist 04/13/19 9:07 AM Pager: [phone] Office: [phone]

## 2019-04-13 NOTE — Anesthesia Postprocedure Evaluation (Signed)
Anesthesia Post Note  Patient: Jody Walls  Procedure(s) Performed: Lumbar Five-Sacral One Anterior Lumbar Interbody Fusion (N/A ) Lumbar Four-Sacral One Posterior Lateral Fusion (N/A ) ABDOMINAL EXPOSURE (N/A )     Patient location during evaluation: PACU Anesthesia Type: General Level of consciousness: awake and alert Pain management: pain level controlled Vital Signs Assessment: post-procedure vital signs reviewed and stable Respiratory status: spontaneous breathing, nonlabored ventilation, respiratory function stable and patient connected to nasal cannula oxygen Cardiovascular status: blood pressure returned to baseline and stable Postop Assessment: no apparent nausea or vomiting Anesthetic complications: no    Last Vitals:  Vitals:   04/13/19 1230 04/13/19 1655  BP: (!) 84/63 126/72  Pulse: 96 94  Resp:  19  Temp: 36.7 C 37.2 C  SpO2: 98% 98%    Last Pain:  Vitals:   04/13/19 1722  TempSrc:   PainSc: [place]

## 2019-04-13 NOTE — Evaluation (Signed)
Occupational Therapy Evaluation Patient Details Name: Domenick GongCrystal B Khun MRN: [id number] DOB: 14-May-1981 Today's Date: 04/13/2019    History of Present Illness Pt is a 38 yo female s/p L5-S1 ALIF and PLIF L4-5. Pt PMHx: back pain, anxiety, depression.   Clinical Impression   Pt PTA: living with spouse/family. Pt reports independence with ADL and mobility. Pt education on LB AE for lower body dressing. Pt with good carry over skills using AE after demonstration. Back handout provided and reviewed ADL in detail. Pt performing log roll with supervisionA. Pt attempting tub transfer, but shooting pain in RLE with hip movement. Tub transfer bench education performed. Pt educated on: clothing between brace, never sleep in brace, set an alarm at night for medication, avoid sitting for long periods of time, correct bed positioning for sleeping, correct sequence for bed mobility, avoiding lifting more than 5 pounds and never wash directly over incision. All education is complete and patient indicates understanding. Pt having severe pain 7/10 throughout session and would benefit from continued OT skilled services for ADL, mobility and safety in OP setting.  Pt would benefit from tub transfer bench for inability to move BLEs well s/p sx.     Follow Up Recommendations  Outpatient OT;Follow surgeon's recommendation for DC plan and follow-up therapies;Supervision - Intermittent    Equipment Recommendations  Tub/shower bench    Recommendations for Other Services       Precautions / Restrictions Precautions Precautions: Back Precaution Booklet Issued: Yes (comment) Precaution Comments: verbally discussed handout Required Braces or Orthoses: Spinal Brace Spinal Brace: Lumbar corset Restrictions Weight Bearing Restrictions: No      Mobility Bed Mobility Overal bed mobility: Needs Assistance Bed Mobility: Rolling Rolling: Supervision         General bed mobility comments: verbal cues to avoid  use of bed rail as her bed at home is without it  Transfers Overall transfer level: Needs assistance Equipment used: Rolling walker (2 wheeled) Transfers: Sit to/from Stand Sit to Stand: Min guard         General transfer comment: requires minguardA for initial standing balance    Balance Overall balance assessment: Needs assistance Sitting-balance support: Bilateral upper extremity supported;Feet supported Sitting balance-Leahy Scale: Fair     Standing balance support: Bilateral upper extremity supported;During functional activity;No upper extremity supported Standing balance-Leahy Scale: Poor                             ADL either performed or assessed with clinical judgement   ADL Overall ADL's : Needs assistance/impaired Eating/Feeding: Modified independent;Sitting   Grooming: Modified independent;Standing   Upper Body Bathing: Modified independent;Sitting   Lower Body Bathing: Minimal assistance;With adaptive equipment;Sitting/lateral leans;Sit to/from stand;Cueing for safety   Upper Body Dressing : Modified independent;Sitting   Lower Body Dressing: Minimal assistance;Cueing for safety;Sitting/lateral leans;Sit to/from stand   Toilet Transfer: Min guard;Regular Toilet;Grab bars   Toileting- ArchitectClothing Manipulation and Hygiene: Min guard;Cueing for safety;Sitting/lateral lean;Sit to/from stand;Adhering to back precautions       Functional mobility during ADLs: Min guard;Cueing for safety;Rolling walker;Cueing for sequencing General ADL Comments: Pt stood at sink for light grooming x3 mins; pt squatting down to commode with grab bar use and pt using AE for LB dressing with good carryover skills from demonstration. Pt limiting pt's performance.      Vision Baseline Vision/History: Wears glasses Vision Assessment?: No apparent visual deficits     Perception     Praxis  Pertinent Vitals/Pain Pain Assessment: 0-10 Pain Score: 7  Pain Location:  low back, abdomen Pain Descriptors / Indicators: Discomfort;Tightness Pain Intervention(s): Limited activity within patient's tolerance     Hand Dominance Right   Extremity/Trunk Assessment Upper Extremity Assessment Upper Extremity Assessment: Overall WFL for tasks assessed   Lower Extremity Assessment Lower Extremity Assessment: Generalized weakness   Cervical / Trunk Assessment Cervical / Trunk Assessment: Other exceptions Cervical / Trunk Exceptions: s/p back sx   Communication Communication Communication: No difficulties   Cognition Arousal/Alertness: Awake/alert Behavior During Therapy: WFL for tasks assessed/performed;Flat affect Overall Cognitive Status: Within Functional Limits for tasks assessed                                     General Comments  Pt not wanting to use RW in room, but advised to and pt requires RW for stability    Exercises     Shoulder Instructions      Home Living Family/patient expects to be discharged to:: Private residence Living Arrangements: Spouse/significant other;Children Available Help at Discharge: Family;Available PRN/intermittently Type of Home: House Home Access: Stairs to enter Entergy CorporationEntrance Stairs-Number of Steps: 7   Home Layout: One level     Bathroom Shower/Tub: Chief Strategy OfficerTub/shower unit   Bathroom Toilet: Standard     Home Equipment: Environmental consultantWalker - 2 wheels;Toilet riser          Prior Functioning/Environment Level of Independence: Independent with assistive device(s)                 OT Problem List: Decreased strength;Decreased activity tolerance;Impaired balance (sitting and/or standing);Decreased coordination;Decreased safety awareness;Pain      OT Treatment/Interventions: Self-care/ADL training;Therapeutic exercise;Neuromuscular education;Energy conservation;DME and/or AE instruction;Therapeutic activities;Patient/family education;Balance training    OT Goals(Current goals can be found in the care plan  section) Acute Rehab OT Goals Patient Stated Goal: to move better without pain OT Goal Formulation: With patient Time For Goal Achievement: 04/27/19 Potential to Achieve Goals: Good ADL Goals Pt Will Perform Lower Body Dressing: with modified independence;sitting/lateral leans;sit to/from stand;with adaptive equipment Pt Will Perform Toileting - Clothing Manipulation and hygiene: with modified independence;sit to/from stand;sitting/lateral leans  OT Frequency: Min 2X/week   Barriers to D/C:            Co-evaluation              AM-PAC OT "6 Clicks" Daily Activity     Outcome Measure Help from another person eating meals?: None Help from another person taking care of personal grooming?: A Little Help from another person toileting, which includes using toliet, bedpan, or urinal?: A Little Help from another person bathing (including washing, rinsing, drying)?: A Little Help from another person to put on and taking off regular upper body clothing?: None Help from another person to put on and taking off regular lower body clothing?: A Little 6 Click Score: 20   End of Session Equipment Utilized During Treatment: Rolling walker;Back brace Nurse Communication: Mobility status  Activity Tolerance: Patient tolerated treatment well Patient left: in bed;with call bell/phone within reach  OT Visit Diagnosis: Unsteadiness on feet (R26.81);Muscle weakness (generalized) (M62.81);Pain Pain - part of body: (back)                Time: 4098-11910804-0825 OT Time Calculation (min): 21 min Charges:  OT General Charges $OT Visit: 1 Visit OT Evaluation $OT Eval Moderate Complexity: 1 Mod  Keylen Uzelac (Jelenek) [place]ell [place] OTR/L Acute  Rehabilitation Services Pager: [phone] Office: [place] 04/13/2019, 8:55 AM

## 2019-04-14 NOTE — Progress Notes (Signed)
Occupational Therapy Treatment Patient Details Name: Jody GongCrystal B Ackers MRN: [id number] DOB: 1980/11/14 Today's Date: 04/14/2019    History of present illness Pt is a 38 yo female s/p L5-S1 ALIF and PLIF L4-5. Pt PMHx: back pain, anxiety, depression.   OT comments  Pt progressing well. Pt ambulating 125' with RW with supervisionA. Pt holding off on LB ADL as pt wanting to shower. Staff aware. Pt transferring in/out of walk in shower with small threshold step. Pt with tub shower at home, but unable to lift BLEs up high enough due to weakness and pain to simulate tub transfer. Pt plans to purchase tub transfer bench for home use. Pt could benefit from continued OT skilled services for ADL, mobility and safety. OT following acutely.    Follow Up Recommendations  No OT follow up    Equipment Recommendations  Tub/shower bench    Recommendations for Other Services      Precautions / Restrictions Precautions Precautions: Back Precaution Booklet Issued: Yes (comment) Precaution Comments: verbally discussed handout Required Braces or Orthoses: Spinal Brace Spinal Brace: Lumbar corset Restrictions Weight Bearing Restrictions: No       Mobility Bed Mobility Overal bed mobility: Modified Independent             General bed mobility comments: increased time. Cues to not use rail  Transfers Overall transfer level: Needs assistance Equipment used: Rolling walker (2 wheeled) Transfers: Sit to/from Stand Sit to Stand: Supervision         General transfer comment: supervisionA for safety    Balance Overall balance assessment: Needs assistance Sitting-balance support: Bilateral upper extremity supported;Feet supported Sitting balance-Leahy Scale: Good     Standing balance support: Bilateral upper extremity supported;During functional activity;No upper extremity supported Standing balance-Leahy Scale: Fair                             ADL either performed or  assessed with clinical judgement   ADL Overall ADL's : Needs assistance/impaired     Grooming: Modified independent;Standing Grooming Details (indicate cue type and reason): pt retrieiving items from bag in room                          Tub/ Shower Transfer: Min guard;Cueing for safety;Cueing for sequencing   Functional mobility during ADLs: Supervision/safety;Cueing for sequencing;Cueing for safety;Rolling walker General ADL Comments: Pt transferring in/out of walk in shower with small threshold step. Pt with tub shower at home, but unable to lift BLEs up high enough due to weakness and pain to simulate tub transfer. Pt plans to purchace tub transfer bench for home use.      Vision   Vision Assessment?: No apparent visual deficits   Perception     Praxis      Cognition Arousal/Alertness: Awake/alert Behavior During Therapy: WFL for tasks assessed/performed;Flat affect Overall Cognitive Status: Within Functional Limits for tasks assessed                                          Exercises     Shoulder Instructions       General Comments Pt tolerating session well compared to yesterday. pt ambulating 125' with supervisionA and use of RW for stability. Pt states that she feels more confident with it.    Pertinent Vitals/ Pain  Pain Assessment: 0-10 Pain Score: 4  Pain Location: low back Pain Descriptors / Indicators: Discomfort;Tightness Pain Intervention(s): Monitored during session;Repositioned  Home Living                                          Prior Functioning/Environment              Frequency  Min 2X/week        Progress Toward Goals  OT Goals(current goals can now be found in the care plan section)  Progress towards OT goals: Progressing toward goals  Acute Rehab OT Goals Patient Stated Goal: to move better without pain OT Goal Formulation: With patient Time For Goal Achievement:  04/27/19 Potential to Achieve Goals: Good ADL Goals Pt Will Perform Lower Body Dressing: with modified independence;sitting/lateral leans;sit to/from stand;with adaptive equipment Pt Will Perform Toileting - Clothing Manipulation and hygiene: with modified independence;sit to/from stand;sitting/lateral leans  Plan Discharge plan remains appropriate    Co-evaluation                 AM-PAC OT "6 Clicks" Daily Activity     Outcome Measure   Help from another person eating meals?: None Help from another person taking care of personal grooming?: None Help from another person toileting, which includes using toliet, bedpan, or urinal?: None Help from another person bathing (including washing, rinsing, drying)?: A Little Help from another person to put on and taking off regular upper body clothing?: None Help from another person to put on and taking off regular lower body clothing?: A Little 6 Click Score: 22    End of Session Equipment Utilized During Treatment: Rolling walker;Back brace  OT Visit Diagnosis: Unsteadiness on feet (R26.81);Muscle weakness (generalized) (M62.81);Pain Pain - part of body: (back)   Activity Tolerance Patient tolerated treatment well   Patient Left in bed;with call bell/phone within reach   Nurse Communication Mobility status        Time: 2482-5003 OT Time Calculation (min): 30 min  Charges: OT General Charges $OT Visit: 1 Visit OT Treatments $Self Care/Home Management : 8-22 mins $Therapeutic Activity: 8-22 mins  Darryl Nestle) Marsa Aris OTR/L Acute Rehabilitation Services Pager: [phone] Office: [phone]    Jenene Slicker Kamoria Lucien 04/14/2019, 8:15 AM

## 2019-04-14 NOTE — Progress Notes (Signed)
Patient is discharged from room 3C02 at this time. Alert and in stable condition. IV site d/c'd and instructions read to patient with understanding verbalized. Left unit via wheelchair with all belongings at side. 

## 2019-04-14 NOTE — Discharge Summary (Signed)
  Physician Discharge Summary  Patient ID: Jody Walls MRN: [id number] DOB/AGE: 40/22/1982 40 y.o. Estimated body mass index is 43.93 kg/m as calculated from the following:   Height as of this encounter: 5\' 3"  (1.6 m).   Weight as of this encounter: 112.5 kg.   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  Consults: Significant Diagnostic Studies: Treatments: Anterior lumbar interbody fusion L5-S1 posterior lumbar interbody fusion L4-5 Discharge Exam: Blood pressure 119/71, pulse 80, temperature 98.6 F (37 C), temperature source Oral, resp. rate 18, height 5\' 3"  (1.6 m), weight 112.5 kg, last menstrual period 03/28/2019, SpO2 98 %. Strength 5 out of 5 wound clean dry and intact  Disposition: Home   Allergies as of 04/14/2019      Reactions   Shellfish Allergy Anaphylaxis, Hives      Medication List    TAKE these medications   aspirin-acetaminophen-caffeine 250-250-65 MG tablet Commonly known as: EXCEDRIN MIGRAINE Take 1 tablet by mouth every 6 (six) hours as needed for headache.   cyclobenzaprine 10 MG tablet Commonly known as: FLEXERIL Take 1 tablet (10 mg total) by mouth 3 (three) times daily as needed for muscle spasms.   levothyroxine 50 MCG tablet Commonly known as: SYNTHROID Take 50 mcg by mouth daily before breakfast.   meloxicam 15 MG tablet Commonly known as: MOBIC Take 15  mg by mouth daily.   oxybutynin 10 MG 24 hr tablet Commonly known as: DITROPAN-XL Take 10 mg by mouth daily.   oxyCODONE-acetaminophen 10-325 MG tablet Commonly known as: Percocet Take 1 tablet by mouth every 6 (six) hours as needed for pain.   pregabalin 50 MG capsule Commonly known as: LYRICA Take 50 mg by mouth 2 (two) times a day.        Signed: Jani Ploeger P 04/14/2019, 12:38 PM

## 2019-04-14 NOTE — Progress Notes (Signed)
Physical Therapy Treatment Patient Details Name: Jody Walls MRN: [id number] DOB: [birth year] Today's Date: 04/14/2019    History of Present Illness Pt is a 38 yo female s/p L5-S1 ALIF and PLIF L4-5. Pt PMHx: back pain, anxiety, depression.    PT Comments    Patient with improved activity tolerance today with improved pain level. Patient performing bed mobility at Mod I with transfers and gait at supervision to min guard level assist. Ambulating 400 feet in hallway and up/down 10 steps without LOB or need for physical assist. Per patient, husband has RW and plans to pick up tub bench - no DME required at discharge. Will continue to follow.     Follow Up Recommendations  Outpatient PT;Follow surgeon's recommendation for DC plan and follow-up therapies     Equipment Recommendations  None recommended by PT    Recommendations for Other Services       Precautions / Restrictions Precautions Precautions: Back Precaution Booklet Issued: Yes (comment) Precaution Comments: verbally discussed handout Required Braces or Orthoses: Spinal Brace Spinal Brace: Lumbar corset;Applied in standing position Restrictions Weight Bearing Restrictions: No    Mobility  Bed Mobility Overal bed mobility: Modified Independent             General bed mobility comments: increased time. Cues to not use rail  Transfers Overall transfer level: Needs assistance Equipment used: Rolling walker (2 wheeled) Transfers: Sit to/from Stand Sit to Stand: Supervision         General transfer comment: supervisionA for safety and immedaite standing balance  Ambulation/Gait Ambulation/Gait assistance: Min guard;Supervision Gait Distance (Feet): 400 Feet Assistive device: Rolling walker (2 wheeled) Gait Pattern/deviations: Step-through pattern;Decreased stride length Gait velocity: decreased   General Gait Details: slow steady pace of gait - able to increase step length with progressive  distances   Stairs Stairs: Yes Stairs assistance: Min guard Stair Management: One rail Right;Step to pattern;Sideways Number of Stairs: 10 General stair comments: no LOB or instability - required use of rail   Wheelchair Mobility    Modified Rankin (Stroke Patients Only)       Balance Overall balance assessment: Needs assistance Sitting-balance support: Bilateral upper extremity supported;Feet supported Sitting balance-Leahy Scale: Good     Standing balance support: Bilateral upper extremity supported;During functional activity;No upper extremity supported Standing balance-Leahy Scale: Fair                              Cognition Arousal/Alertness: Awake/alert Behavior During Therapy: WFL for tasks assessed/performed;Flat affect Overall Cognitive Status: Within Functional Limits for tasks assessed                                        Exercises      General Comments General comments (skin integrity, edema, etc.): improved tolerance to mobility      Pertinent Vitals/Pain Pain Assessment: 0-10 Pain Score: 3  Pain Location: low back Pain Descriptors / Indicators: Discomfort;Tightness Pain Intervention(s): Limited activity within patient's tolerance;Monitored during session;Repositioned    Home Living                      Prior Function            PT Goals (current goals can now be found in the care plan section) Acute Rehab PT Goals Patient Stated Goal: to move better without pain  PT Goal Formulation: With patient Time For Goal Achievement: 04/27/19 Potential to Achieve Goals: Good Progress towards PT goals: Progressing toward goals    Frequency    Min 5X/week      PT Plan Current plan remains appropriate    Co-evaluation              AM-PAC PT "6 Clicks" Mobility   Outcome Measure  Help needed turning from your back to your side while in a flat bed without using bedrails?: A Little Help needed  moving from lying on your back to sitting on the side of a flat bed without using bedrails?: A Little Help needed moving to and from a bed to a chair (including a wheelchair)?: A Little Help needed standing up from a chair using your arms (e.g., wheelchair or bedside chair)?: A Little Help needed to walk in hospital room?: A Little Help needed climbing 3-5 steps with a railing? : A Little 6 Click Score: 18    End of Session Equipment Utilized During Treatment: Gait belt;Back brace Activity Tolerance: Patient tolerated treatment well Patient left: in bed;with call bell/phone within reach Nurse Communication: Mobility status PT Visit Diagnosis: Unsteadiness on feet (R26.81);Other abnormalities of gait and mobility (R26.89);Muscle weakness (generalized) (M62.81);Pain     Time: 9741-6384 PT Time Calculation (min) (ACUTE ONLY): 24 min  Charges:  $Gait Training: 8-22 mins $Therapeutic Activity: 8-22 mins                      Lanney Gins, PT, DPT Supplemental Physical Therapist 04/14/19 11:59 AM Pager: [phone] Office: [phone]

## 2019-04-17 ENCOUNTER — Encounter (HOSPITAL_COMMUNITY): Payer: Self-pay | Admitting: Neurosurgery

## 2019-05-24 ENCOUNTER — Other Ambulatory Visit: Payer: Self-pay | Admitting: Neurosurgery

## 2019-05-24 DIAGNOSIS — M4316 Spondylolisthesis, lumbar region: Secondary | ICD-10-CM

## 2019-06-06 ENCOUNTER — Other Ambulatory Visit: Payer: Self-pay

## 2019-06-06 ENCOUNTER — Ambulatory Visit
Admission: RE | Admit: 2019-06-06 | Discharge: 2019-06-06 | Disposition: A | Discharge status: Home / Self Care | Payer: BC Managed Care – PPO | Source: Ambulatory Visit | Attending: Neurosurgery | Admitting: Neurosurgery

## 2019-06-06 DIAGNOSIS — M4316 Spondylolisthesis, lumbar region: Secondary | ICD-10-CM | POA: Insufficient documentation

## 2019-10-09 DIAGNOSIS — F3341 Major depressive disorder, recurrent, in partial remission: Secondary | ICD-10-CM | POA: Insufficient documentation

## 2020-03-10 ENCOUNTER — Encounter: Payer: Self-pay | Admitting: Emergency Medicine

## 2020-03-10 ENCOUNTER — Emergency Department
Admission: EM | Admit: 2020-03-10 | Discharge: 2020-03-10 | Disposition: A | Discharge status: Home / Self Care | Payer: BC Managed Care – PPO | Attending: Emergency Medicine | Admitting: Emergency Medicine

## 2020-03-10 ENCOUNTER — Other Ambulatory Visit: Payer: Self-pay

## 2020-03-10 DIAGNOSIS — Z23 Encounter for immunization: Secondary | ICD-10-CM | POA: Insufficient documentation

## 2020-03-10 DIAGNOSIS — Y999 Unspecified external cause status: Secondary | ICD-10-CM | POA: Diagnosis not present

## 2020-03-10 DIAGNOSIS — Y929 Unspecified place or not applicable: Secondary | ICD-10-CM | POA: Diagnosis not present

## 2020-03-10 DIAGNOSIS — S0121XA Laceration without foreign body of nose, initial encounter: Secondary | ICD-10-CM | POA: Insufficient documentation

## 2020-03-10 DIAGNOSIS — W540XXA Bitten by dog, initial encounter: Secondary | ICD-10-CM | POA: Insufficient documentation

## 2020-03-10 DIAGNOSIS — Y939 Activity, unspecified: Secondary | ICD-10-CM | POA: Diagnosis not present

## 2020-03-10 DIAGNOSIS — Z79899 Other long term (current) drug therapy: Secondary | ICD-10-CM | POA: Insufficient documentation

## 2020-03-10 MED ORDER — TETANUS-DIPHTH-ACELL PERTUSSIS 5-2.5-18.5 LF-MCG/0.5 IM SUSP
0.5000 mL | Freq: Once | INTRAMUSCULAR | Status: AC
  Administered 2020-03-10: 0.5 mL via INTRAMUSCULAR
  Filled 2020-03-10: qty 0.5

## 2020-03-10 MED ORDER — AMOXICILLIN-POT CLAVULANATE 875-125 MG PO TABS
1.0000 | ORAL_TABLET | Freq: Once | ORAL | Status: AC
  Administered 2020-03-10: 1 via ORAL
  Filled 2020-03-10: qty 1

## 2020-03-10 MED ORDER — AMOXICILLIN-POT CLAVULANATE 875-125 MG PO TABS: 1.0000 | ORAL_TABLET | Freq: Two times a day (BID) | ORAL | 0 refills | Status: AC

## 2020-03-10 NOTE — ED Notes (Signed)
Pt with small laceration to left nostril. Bleeding is controlled at this time.

## 2020-03-10 NOTE — Discharge Instructions (Addendum)
Please apply thin layer of antibiotic ointment once daily.  Take antibiotics as prescribed.  Follow-up with PCP, walk-in clinic or ER in 5 to 6 days for suture removal.

## 2020-03-10 NOTE — ED Triage Notes (Signed)
Pt presents to ED via POV with c/o dog bite to nose, small laceration to L nare, bleeding controlled upon arrival to ED. Pt states was her dog, pt states it was her dog, up to date on all shots at this time.

## 2020-03-10 NOTE — ED Provider Notes (Signed)
[facility] EMERGENCY DEPARTMENT Provider Note   CSN: [id number] Arrival date & time: 03/10/20  1351     History Chief Complaint  Patient presents with   Animal Bite    Jody Walls is a 39 y.o. female presents the emerge department for evaluation of dog bite.  Patient's personal small dog bit her in the left nare.  She has an intranasal laceration.  Tetanus is not up-to-date.  Dog's vaccinations are up-to-date.  She has very little pain.  Bleeding well controlled.  No difficulty breathing through the nose.  HPI     Past Medical History:  Diagnosis Date   Anxiety    Depression    Headache    Hypothyroidism    Leg pain, posterior    Down to her calf.   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/14/2018   Anxiety 08/23/2017    Past Surgical History:  Procedure Laterality Date   ABDOMINAL EXPOSURE N/A 04/12/2019   Procedure: ABDOMINAL EXPOSURE;  Surgeon: Larina Earthly, MD;  Location: [facility] OR;  Service: Vascular;  Laterality: N/A;   ANTERIOR LUMBAR FUSION N/A 04/12/2019   Procedure: Lumbar Five-Sacral One Anterior Lumbar Interbody Fusion;  Surgeon: Donalee Citrin, MD;  Location: [facility] OR;  Service: Neurosurgery;  Laterality: N/A;  Lumbar Five-Sacral One Anterior Lumbar Interbody Fusion   arm fracture Right 2003   CESAREAN SECTION  2010   CHOLECYSTECTOMY     LAMINECTOMY WITH POSTERIOR LATERAL ARTHRODESIS LEVEL 2 N/A 04/12/2019   Procedure: Lumbar Four-Sacral One Posterior Lateral Fusion;  Surgeon: Donalee Citrin, MD;  Location: [facility] OR;  Service: Neurosurgery;  Laterality: N/A;  Lumbar Four-Sacral One Posterior Lateral Fusion   TUBAL LIGATION       OB History   No obstetric history on  file.     Family History  Problem Relation Age of Onset   COPD Mother    Cancer Mother    Heart disease Mother    Cancer Father    Drug abuse Brother    Drug abuse Brother     Social History   Tobacco Use   Smoking status: Never Smoker   Smokeless tobacco: Never Used  Substance Use Topics   Alcohol use: Yes    Comment: occassionally    Drug use: Never    Home Medications Prior to Admission medications   Medication Sig Start Date End Date Taking? Authorizing Provider  amoxicillin-clavulanate (AUGMENTIN) 875-125 MG tablet Take 1 tablet by mouth every 12 (twelve) hours for 10 days. 03/10/20 03/20/20  Evon Slack, PA-C  aspirin-acetaminophen-caffeine (EXCEDRIN MIGRAINE) [phone] MG tablet Take 1 tablet by mouth every 6 (six) hours as needed for headache.    [provider]  cyclobenzaprine (FLEXERIL) 10 MG tablet Take 1 tablet (10 mg total) by mouth 3 (three) times daily as needed for muscle spasms. 04/13/19   Meyran, Tiana Loft, NP  levothyroxine (SYNTHROID, LEVOTHROID) 50 MCG tablet Take 50 mcg by mouth daily before breakfast.  12/06/18 12/06/19  [provider]  meloxicam (MOBIC) 15 MG tablet Take 15 mg by mouth daily. 10/07/18   [provider]  oxyCODONE-acetaminophen (PERCOCET) 10-325 MG tablet Take 1 tablet by mouth every 6 (six) hours as needed for  pain. 04/13/19 04/12/20  Meyran, Tiana Loft, NP  pregabalin (LYRICA) 50 MG capsule Take 50 mg by mouth 2 (two) times a day. 03/18/19   [provider]    Allergies    Shellfish allergy  Review of Systems   Review of Systems  Skin: Positive for wound. Negative for color change and rash.    Physical Exam Updated Vital Signs BP (!) 125/48 (BP Location: Left Arm)    Pulse 76    Temp 99 F (37.2 C) (Oral)    Resp 20    Ht 5\' 3"  (1.6 m)    Wt 101.6 kg    SpO2 99%    BMI 39.68 kg/m   Physical Exam Constitutional:      Appearance: She is well-developed.  HENT:      Head: Normocephalic.     Comments: 0.5 cm intranasal laceration along the corner of the left nare.  Wound margins are well approximated unless patient smiles and wiggles her nose this does cause some gaping along the laceration site.  No visible or palpable foreign body.  No signs of septal hematoma.    Nose:     Comments: Small left nare laceration Eyes:     Conjunctiva/sclera: Conjunctivae normal.  Cardiovascular:     Rate and Rhythm: Normal rate.  Pulmonary:     Effort: Pulmonary effort is normal. No respiratory distress.  Musculoskeletal:        General: Normal range of motion.     Cervical back: Normal range of motion.  Skin:    General: Skin is warm.     Findings: No rash.  Neurological:     Mental Status: She is alert and oriented to person, place, and time.  Psychiatric:        Behavior: Behavior normal.        Thought Content: Thought content normal.     ED Results / Procedures / Treatments   Labs (all labs ordered are listed, but only abnormal results are displayed) Labs Reviewed - No data to display  EKG None  Radiology No results found.  Procedures . Laceration Repair  Date/Time: 03/10/2020 3:48 PM Performed by: 03/12/2020, PA-C Authorized by: Evon Slack, PA-C   Consent:    Consent obtained:  Verbal   Consent given by:  Patient   Risks discussed:  Infection Anesthesia (see MAR for exact dosages):    Anesthesia method:  None Laceration details:    Location:  Face   Face location:  Nose   Length (cm):  0.5   Depth (mm):  1 Repair type:    Repair type:  Simple Pre-procedure details:    Preparation:  Patient was prepped and draped in usual sterile fashion Exploration:    Wound exploration: wound explored through full range of motion and entire depth of wound probed and visualized     Contaminated: yes   Treatment:    Area cleansed with:  Betadine and saline   Amount of cleaning:  Standard   Irrigation volume:  5   Irrigation method:   Pressure wash   Visualized foreign bodies/material removed: no   Skin repair:    Repair method:  Sutures   Suture size:  6-0   Suture material:  Nylon   Suture technique:  Simple interrupted   Number of sutures:  1 Approximation:    Approximation:  Close Post-procedure details:    Dressing:  Open (no dressing)   (including critical care time)  Medications Ordered in ED  Medications  Tdap (BOOSTRIX) injection 0.5 mL (has no administration in time range)  amoxicillin-clavulanate (AUGMENTIN) 875-125 MG per tablet 1 tablet (has no administration in time range)    ED Course  I have reviewed the triage vital signs and the nursing notes.  Pertinent labs & imaging results that were available during my care of the patient were reviewed by me and considered in my medical decision making (see chart for details).    MDM Rules/Calculators/A&P                          39 year old female with lacerations of the left nare.  Wound margins well approximated but with facial expressions, gaping noted.  A single suture was placed along the inside of the left nare to hold wound together.  Wound was thoroughly irrigated with Betadine and saline and patient placed on prophylactic antibiotics.  Tetanus updated in the ED.  Dog's vaccinations are up-to-date.  She understands wound care and follow-up in 5 to 6 days for suture removal. Final Clinical Impression(s) / ED Diagnoses Final diagnoses:  Dog bite, initial encounter  Laceration of nose, initial encounter    Rx / DC Orders ED Discharge Orders         Ordered    amoxicillin-clavulanate (AUGMENTIN) 875-125 MG tablet  Every 12 hours     Discontinue  Reprint     03/10/20 1532           Duanne Guess, PA-C 03/10/20 1615    Duffy Bruce, MD 03/11/20 [phone]

## 2020-09-27 IMAGING — CR OR LOCAL ABDOMEN
1 series · 1 of 1 positions shown · non-contrast
Comparison: CT 11/03/2018.

CLINICAL DATA: Evaluate for retained surgical instrument.

EXAM:
OR LOCAL ABDOMEN

[AP]
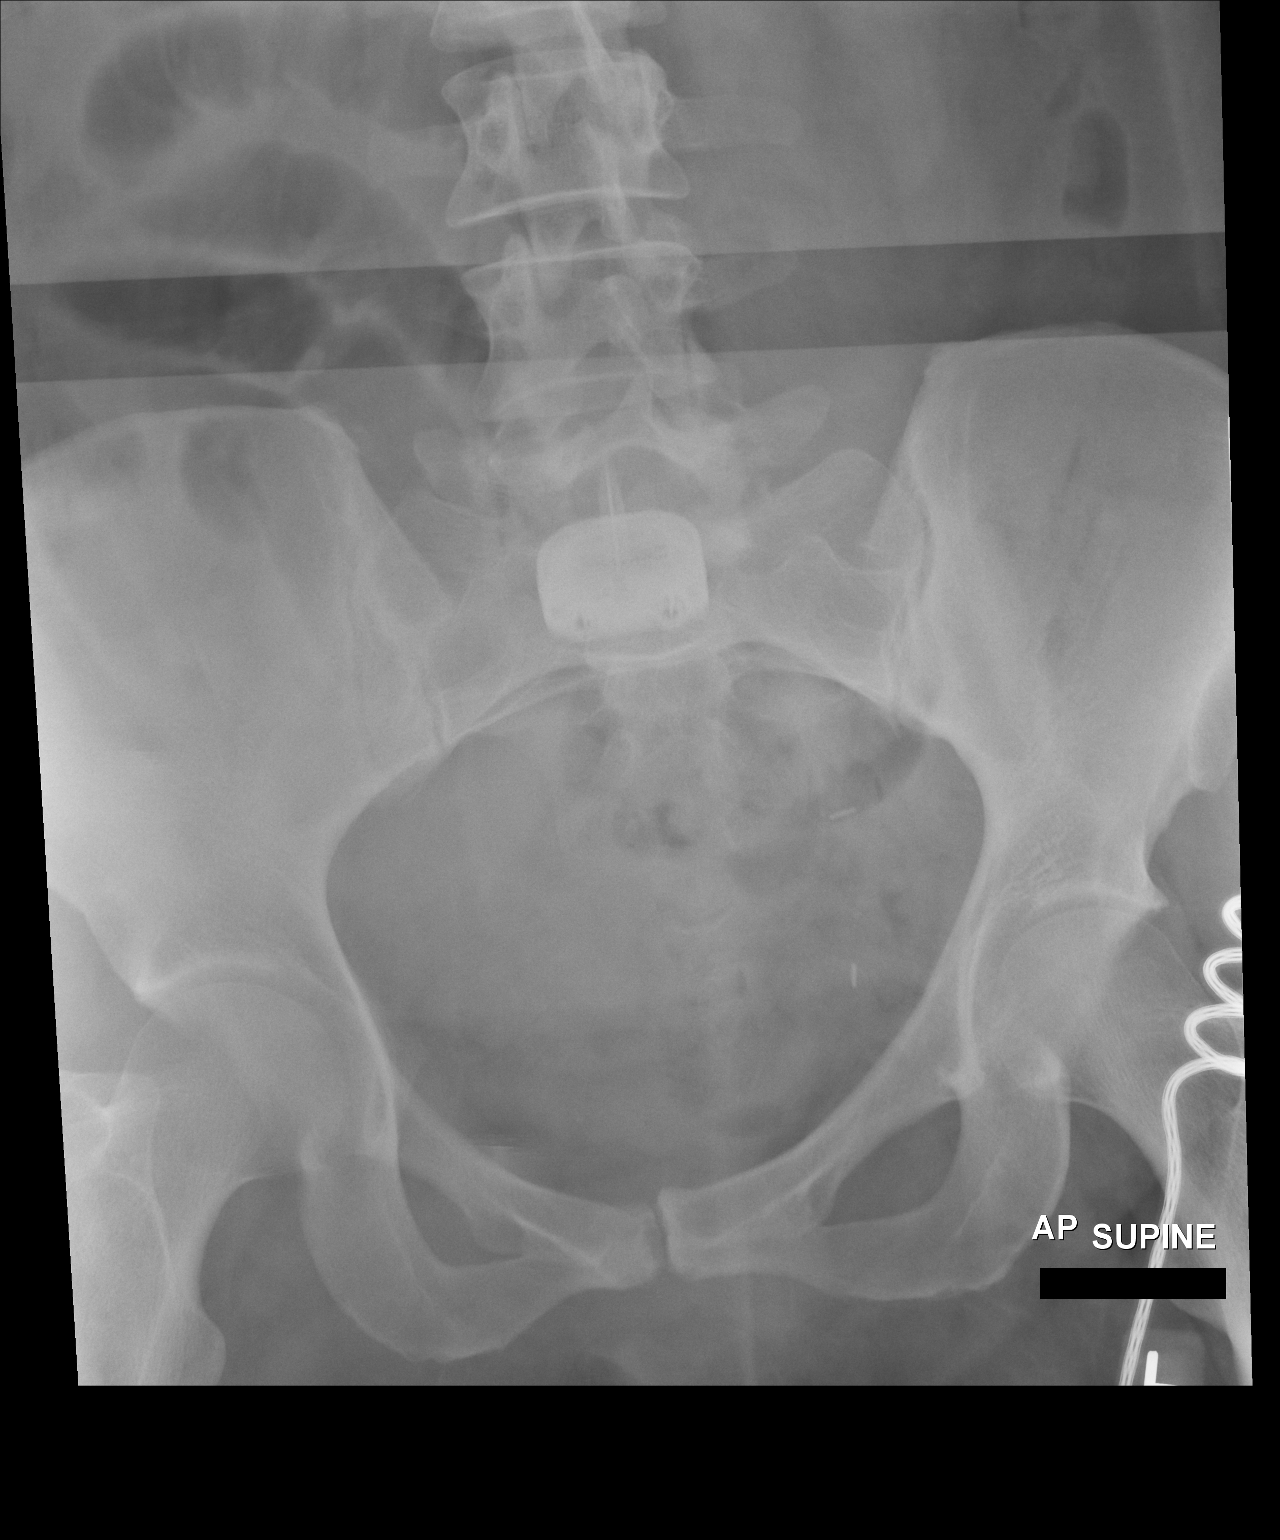

[1 of 1 positions shown; findings below may reference images not displayed]

FINDINGS: Postsurgical changes of the lumbosacral spine. Hardware intact. Mild
bowel distention. Mild adynamic ileus cannot be excluded. Two
surgical clips left lower quadrant. A radiopacity is noted the right
lower pelvis. This may be film artifact or outside the patient. A
remnant from surgery such as a small sponge cannot be totally
excluded.
IMPRESSION: Radiopacity noted over the right lower pelvis. This may be film
artifact or outside of the patient. A remnant from surgery such as a
small sponge cannot be totally excluded. Repeat exam can be obtained
as needed. Two surgical clips noted over the left pelvis. This
report was phoned to the operating room at the time of this
dictation.

## 2020-09-27 IMAGING — RF LUMBAR SPINE - 2-3 VIEW
1 series · 2 of 2 positions shown · non-contrast
Comparison: CT lumbar spine 11/03/2018

FLUOROSCOPY TIME:  2 minutes 13 seconds

Images obtained: 2

CLINICAL DATA: Lumbar fusion L4-S1

EXAM:
DG C-ARM 61-120 MIN; LUMBAR SPINE - 2-3 VIEW

[Series 1: run · 2 of 2 slices shown]
[im 1/2]
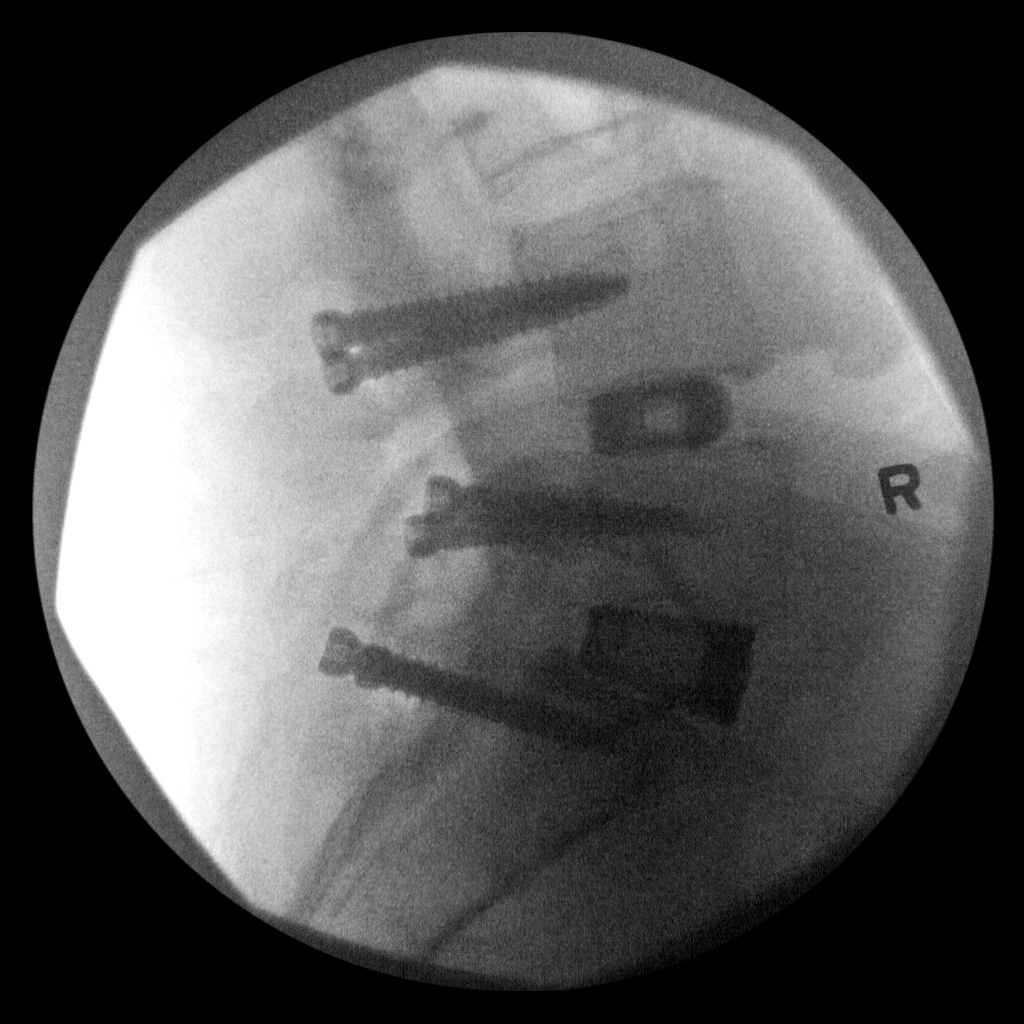
[im 2/2]
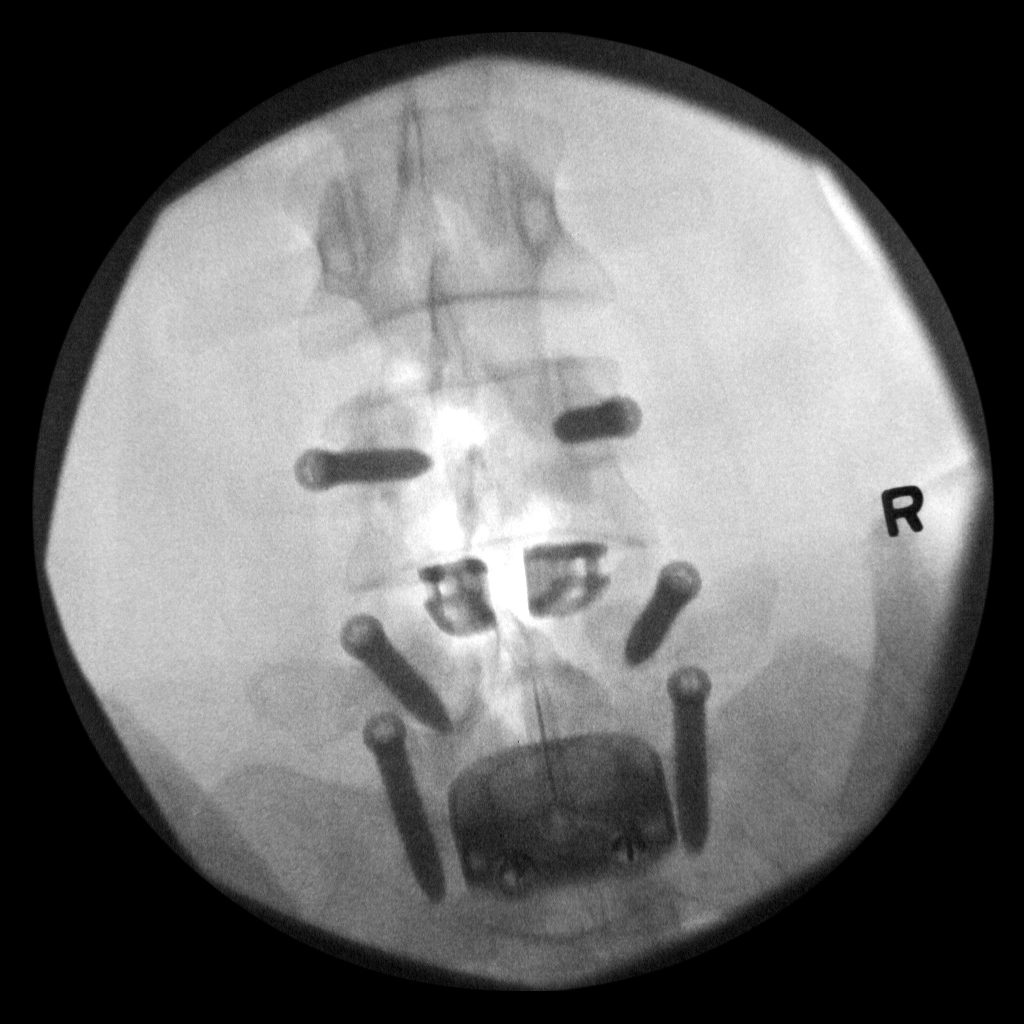

[2 of 2 positions shown; findings below may reference images not displayed]

FINDINGS: Exam labeled with 5 lumbar vertebra.

Images demonstrate disc prostheses at the L4-L5 and L5-S1 disc
spaces.

BILATERAL pedicle screws are present at L4, L5, and S1.

Vertebral body heights appear maintained with normal alignment.
IMPRESSION: Intraoperative images during posterior fusion L4-S1 as above.

## 2020-09-27 IMAGING — CR OR LOCAL ABDOMEN
1 series · 1 of 1 positions shown · non-contrast
Comparison: KUB same day.

CLINICAL DATA: Evaluate for retained surgical instrument/sponge.

EXAM:
OR LOCAL ABDOMEN

[AP]
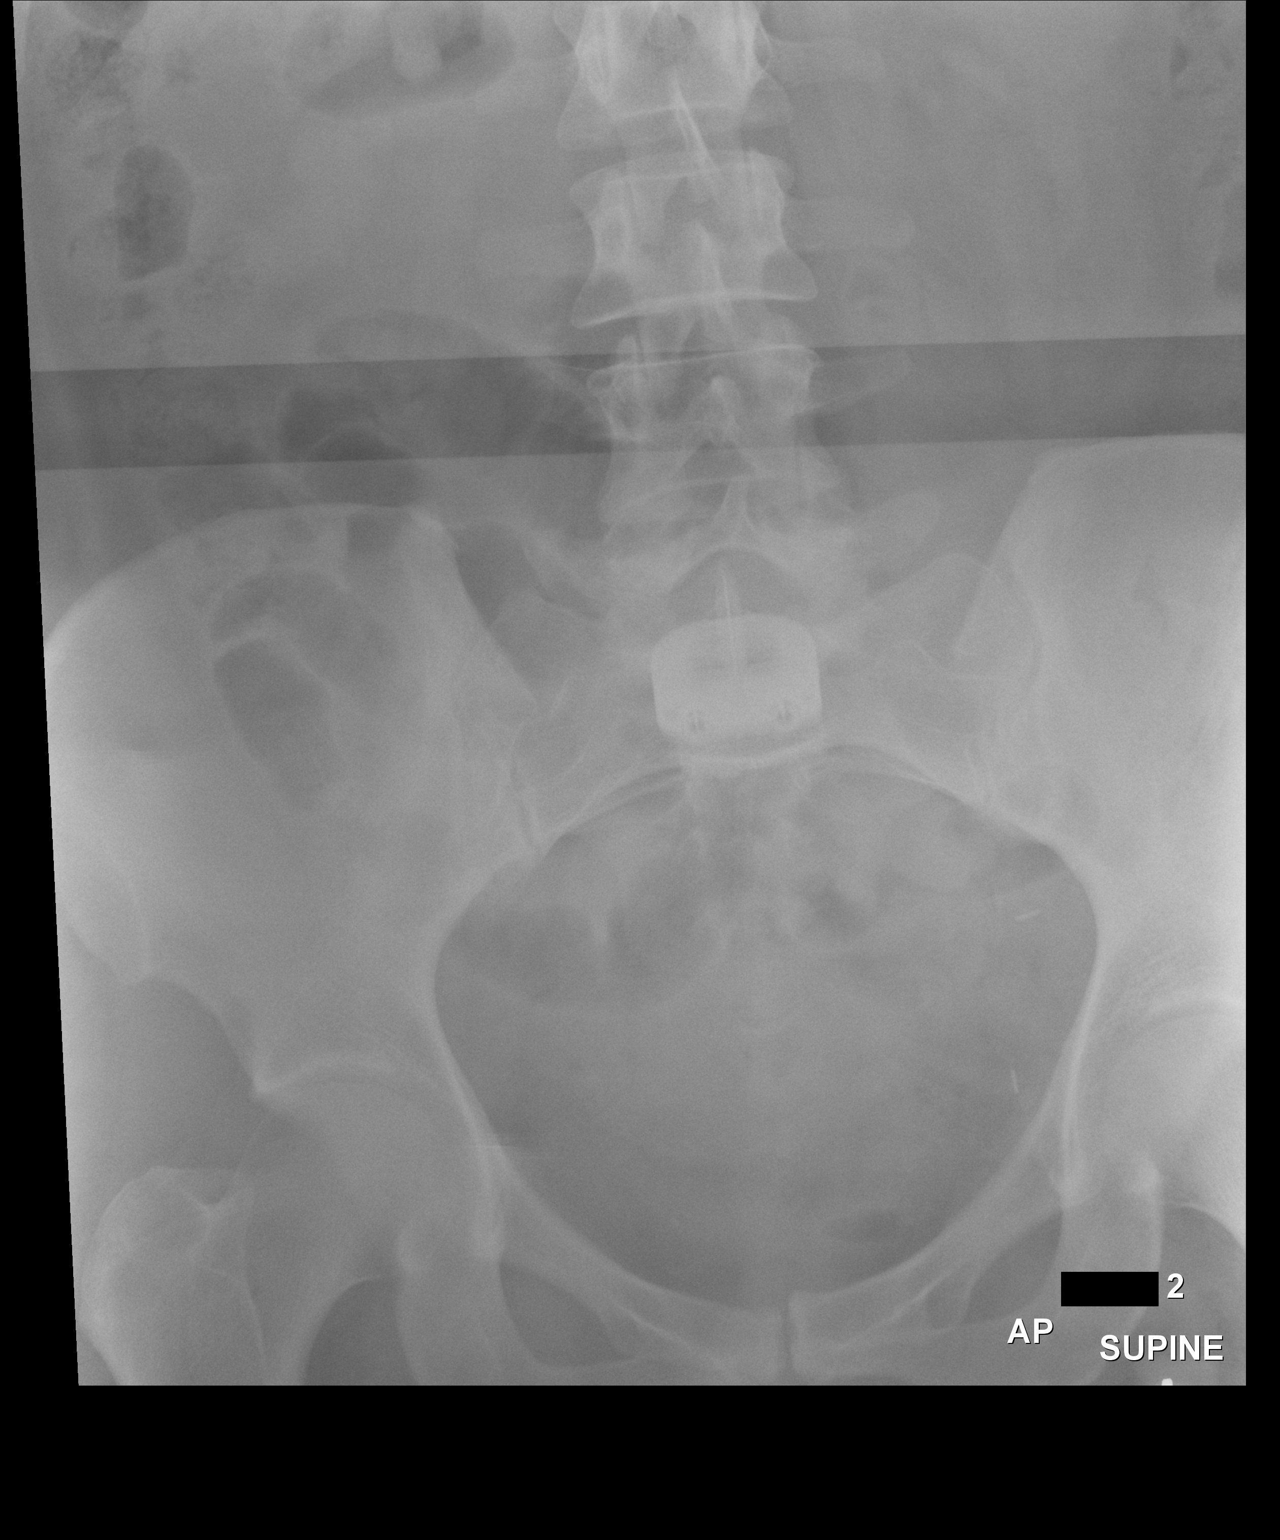

[1 of 1 positions shown; findings below may reference images not displayed]

FINDINGS: Postsurgical changes lumbar spine mild bowel distention again
noted. Surgical clips left lower quadrant. Radiopacity is again
noted over the right lower pelvis. This extends over the right hip
and again may be outside the patient. Clinical correlation suggested
to exclude retained surgical sponge.
IMPRESSION: Persistent radiopacity noted over the right lower pelvis. This
projects over the right hip on this exam. This may be external to
the patient. Clinical correlation suggested to exclude a retained
surgical sponge. This report was phoned to the operating room at the
time the study.

## 2020-11-08 DIAGNOSIS — E7800 Pure hypercholesterolemia, unspecified: Secondary | ICD-10-CM | POA: Insufficient documentation

## 2020-11-21 IMAGING — CT CT L SPINE W/O CM
2 of 3 series · 12 of 33 positions shown, 15 images · non-contrast
Comparison: CT of the lumbar spine without contrast 11/03/2018. MR
of the lumbar spine without contrast 06/14/2018.

CLINICAL DATA: Lumbar spine surgery 04/13/2019 with persistent
pain.

EXAM:
CT LUMBAR SPINE WITHOUT CONTRAST
TECHNIQUE: Multidetector CT imaging of the lumbar spine was performed without
intravenous contrast administration. Multiplanar CT image
reconstructions were also generated.

[Series 4: l spine soft (person_name) · axial · 0.35mm/px · z∈[-907,-615]mm · 9 of 174 slices shown, 12 images]
[im 14/174  soft-tissue]
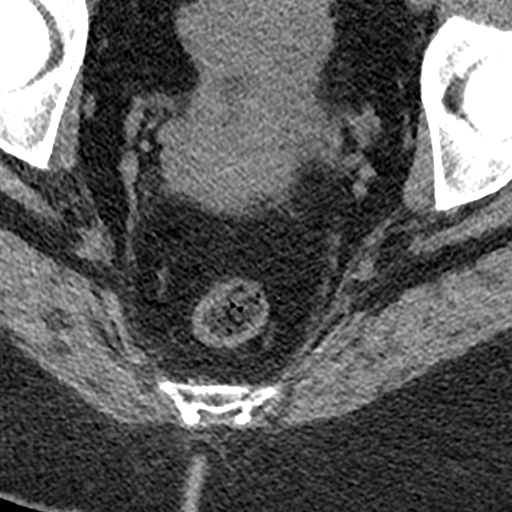
[im 14/174  bone]
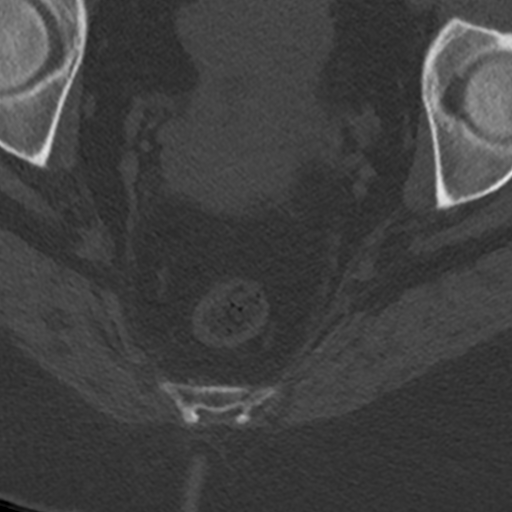
[im 40/174  bone]
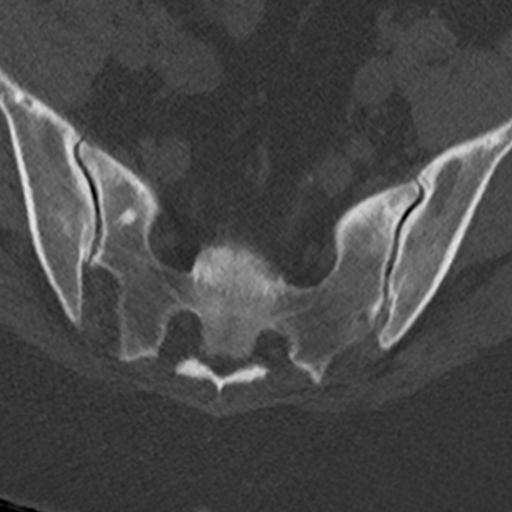
[im 54/174  bone]
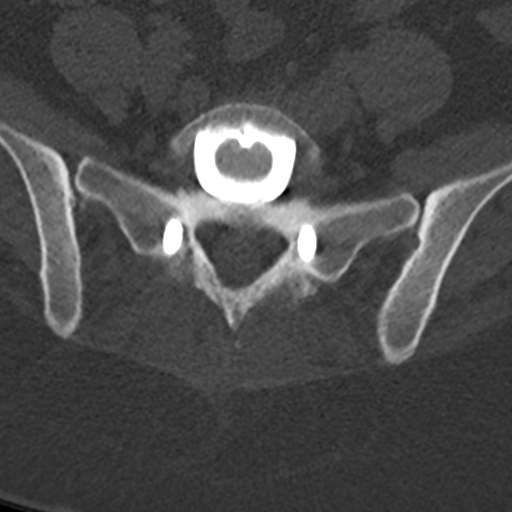
[im 67/174  bone]
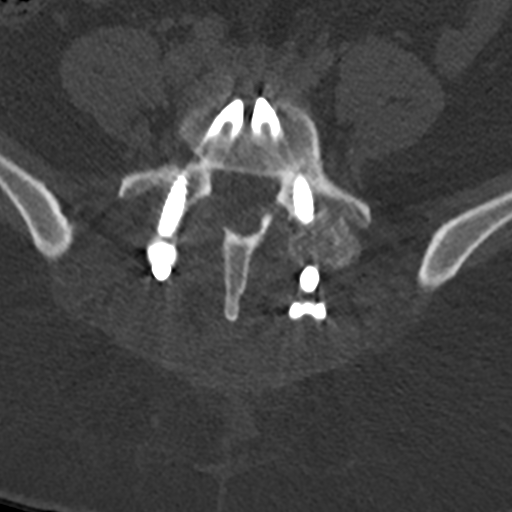
[im 94/174  soft-tissue]
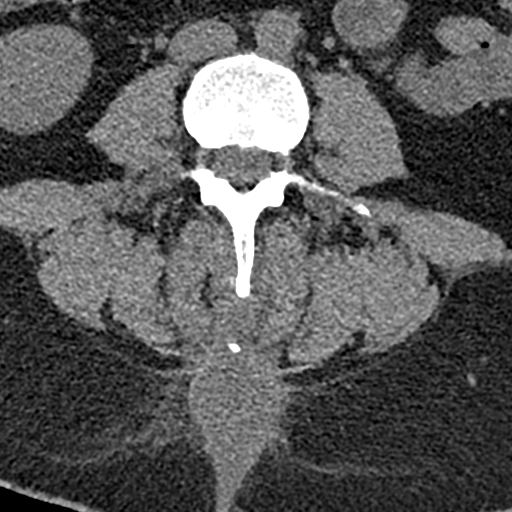
[im 94/174  bone]
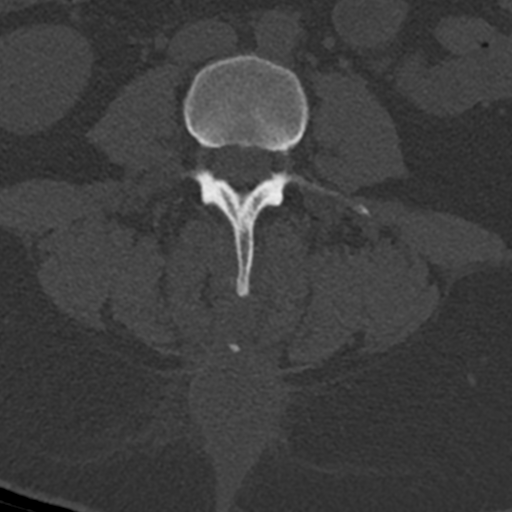
[im 107/174  bone]
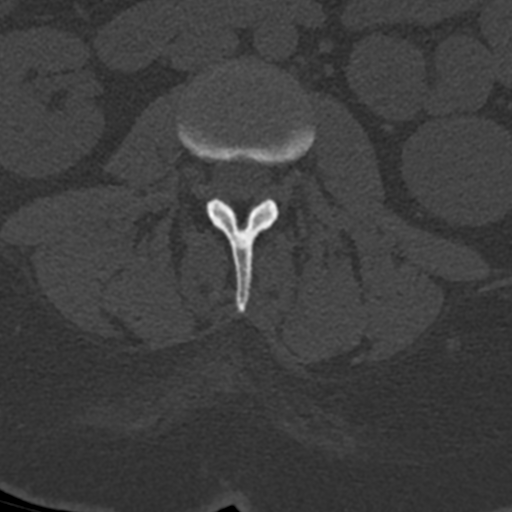
[im 120/174  bone]
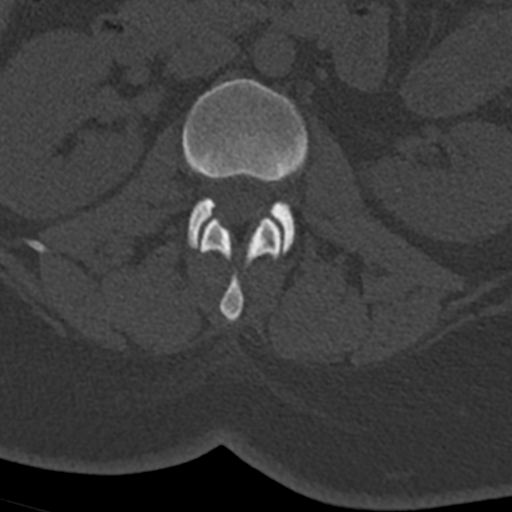
[im 147/174  bone]
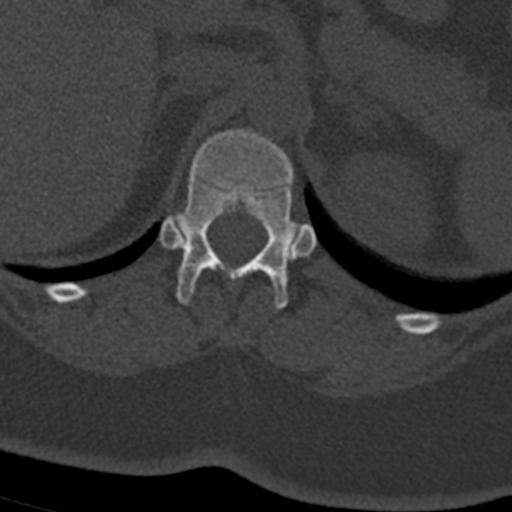
[im 160/174  soft-tissue]
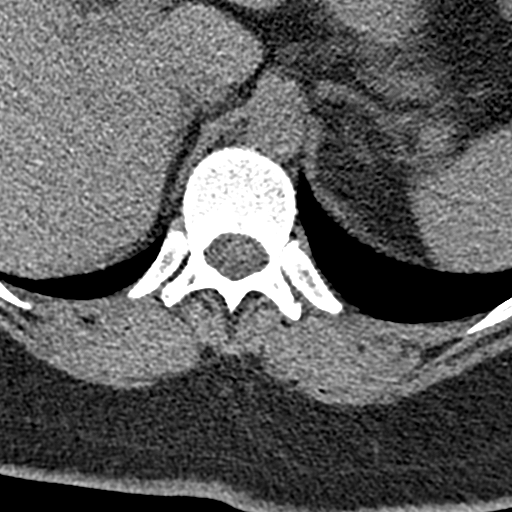
[im 160/174  bone]
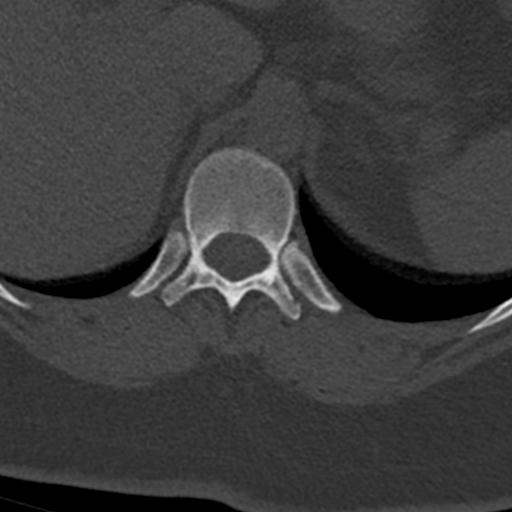

[Series 6: coronal bone · coronal · 0.27mm/px · 3 of 73 slices shown]
[im 15/73  bone]
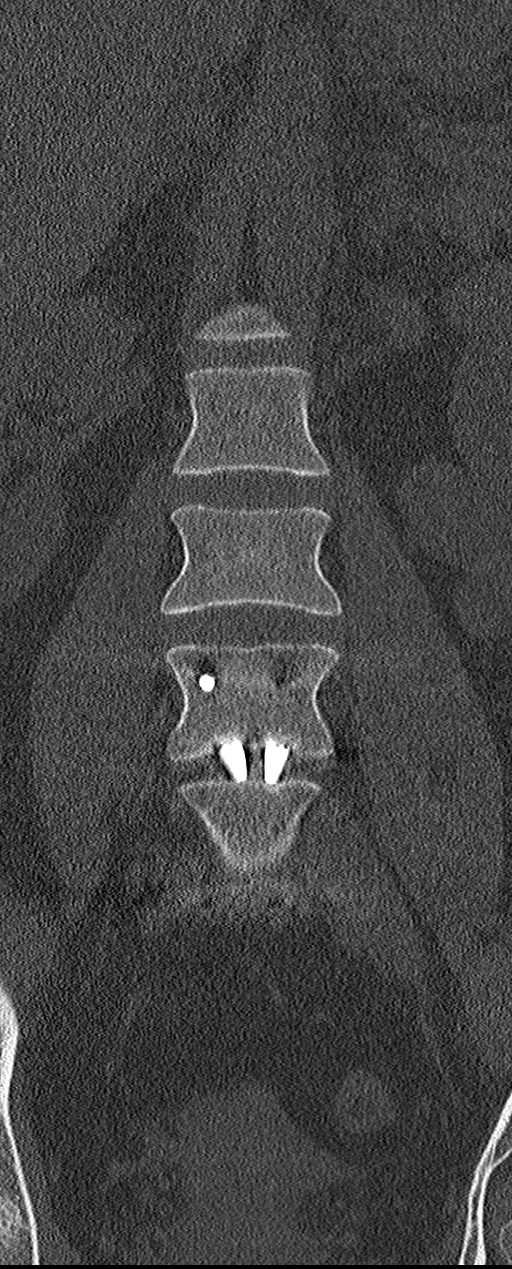
[im 29/73  bone]
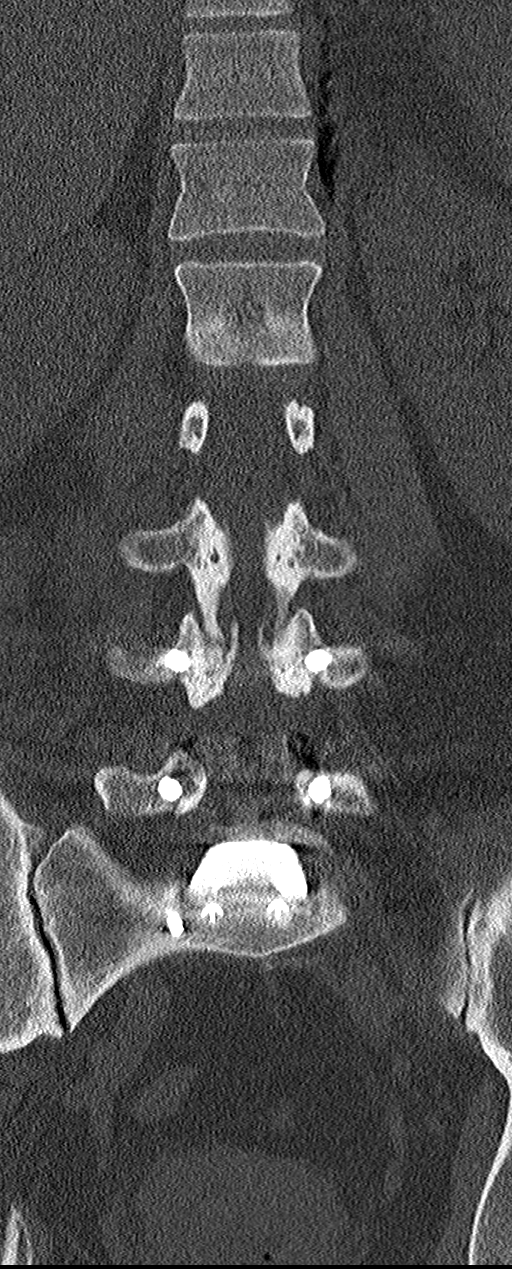
[im 44/73  bone]
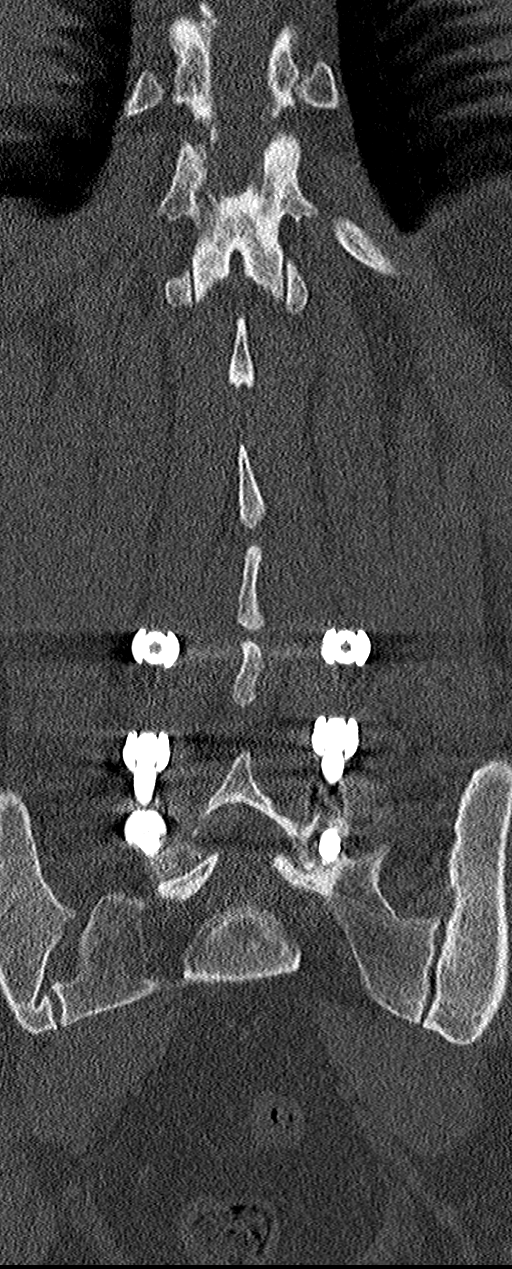

[12 of 33 positions shown; findings below may reference images not displayed]

FINDINGS: Segmentation: 5 non rib-bearing lumbar type vertebral bodies are
present. The lowest fully formed vertebral body is L5.

Alignment: Normal lumbar lordosis is present.

Vertebrae: Vertebral body heights are maintained.

Paraspinal and other soft tissues: Limited imaging the abdomen is
unremarkable. There is no significant adenopathy. No solid organ
lesions are present.

Disc levels: Disc levels at L3-4 above are normal.

L4-5: Patient is status post discectomy. Disc spacer is well
positioned. Laminectomies and foraminotomies are noted bilaterally.
There is no residual or recurrent stenosis.

L5-S1: Anterior fusion is noted. Disc spacer is in satisfactory
position. Right laminectomy is noted. Right foramina me is noted.
The central canal and foramina are widely patent.
IMPRESSION: 1. Laminectomies and fusion at L4-5 and L5-S1 without residual or
recurrent stenosis by CT.
2. Hardware is intact.  The

## 2023-01-11 ENCOUNTER — Ambulatory Visit (INDEPENDENT_AMBULATORY_CARE_PROVIDER_SITE_OTHER): Payer: BC Managed Care – PPO | Admitting: Internal Medicine

## 2023-01-11 VITALS — BP 121/82 | HR 81 | Resp 14 | Ht 63.0 in | Wt 243.0 lb

## 2023-01-11 DIAGNOSIS — E611 Iron deficiency: Secondary | ICD-10-CM | POA: Diagnosis not present

## 2023-01-11 DIAGNOSIS — G2581 Restless legs syndrome: Secondary | ICD-10-CM | POA: Diagnosis not present

## 2023-01-11 DIAGNOSIS — G4700 Insomnia, unspecified: Secondary | ICD-10-CM | POA: Diagnosis not present

## 2023-01-11 NOTE — Patient Instructions (Signed)
Restless Legs Syndrome Restless legs syndrome is a condition that causes uncomfortable feelings or sensations in the legs, especially while sitting or lying down. The sensations usually cause an overwhelming urge to move the legs. The arms can also sometimes be affected. The condition can range from mild to severe. The symptoms often interfere with a person's ability to sleep. What are the causes? The cause of this condition is not known. What increases the risk? The following factors may make you more likely to develop this condition: Being older than 50. Pregnancy. Being a woman. In general, the condition is more common in women than in men. A family history of the condition. Having iron deficiency. Overuse of caffeine, nicotine, or alcohol. Certain medical conditions, such as kidney disease, Parkinson's disease, or nerve damage. Certain medicines, such as those for high blood pressure, nausea, colds, allergies, depression, and some heart conditions. What are the signs or symptoms? The main symptom of this condition is uncomfortable sensations in the legs, such as: Pulling. Tingling. Prickling. Throbbing. Crawling. Burning. Usually, the sensations: Affect both sides of the body. Are worse when you sit or lie down. Are worse at night. These may make it difficult to fall asleep. Make you have a strong urge to move your legs. Are temporarily relieved by moving your legs or standing. The arms can also be affected, but this is rare. People who have this condition often have tiredness during the day because of their lack of sleep at night. How is this diagnosed? This condition may be diagnosed based on: Your symptoms. Blood tests. In some cases, you may be monitored in a sleep lab by a specialist (a sleep study). This can detect any disruptions in your sleep. How is this treated? This condition is treated by managing the symptoms. This may include: Lifestyle changes, such as  exercising, using relaxation techniques, and avoiding caffeine, alcohol, or tobacco. Iron supplements. Medicines. Parkinson's medications may be tried first. Anti-seizure medications can also be helpful. Follow these instructions at home: General instructions Take over-the-counter and prescription medicines only as told by your health care provider. Use methods to help relieve the uncomfortable sensations, such as: Massaging your legs. Walking or stretching. Taking a cold or hot bath. Keep all follow-up visits. This is important. Lifestyle     Practice good sleep habits. For example, go to bed and get up at the same time every day. Most adults should get 7-9 hours of sleep each night. Exercise regularly. Try to get at least 30 minutes of exercise most days of the week. Practice ways of relaxing, such as yoga or meditation. Avoid caffeine and alcohol. Do not use any products that contain nicotine or tobacco. These products include cigarettes, chewing tobacco, and vaping devices, such as e-cigarettes. If you need help quitting, ask your health care provider. Where to find more information National Institute of Neurological Disorders and Stroke: www.ninds.nih.gov Contact a health care provider if: Your symptoms get worse or they do not improve with treatment. Summary Restless legs syndrome is a condition that causes uncomfortable feelings or sensations in the legs, especially while sitting or lying down. The symptoms often interfere with your ability to sleep. This condition is treated by managing the symptoms. You may need to make lifestyle changes or take medicines. This information is not intended to replace advice given to you by your health care provider. Make sure you discuss any questions you have with your health care provider. Document Revised: 04/20/2021 Document Reviewed: 04/20/2021 Elsevier Patient Education    2023 [employer]  

## 2023-01-11 NOTE — Progress Notes (Signed)
Sleep Medicine   Office Visit  Patient Name: Jody Walls DOB: May 07, 1981 MRN [id number]    Chief Complaint: insomnia/sleep study results  Brief History:  Jody Walls presents with a 10 year history of insomnia. "I've had sleep issues for many years".  Has tried multiple meds in past for sleep in past, no improvement.  Sleep quality is poor. This is noted most nights. The patient's bed partner reports snoring at night. The patient relates the following symptoms: Insomnia, snoring, difficulty getting to sleep and staying asleep, headaches and excessive daytime sleepiness are also present. The patient goes to sleep at 10 pm and wakes up at 6 am.  Sleep quality is same when outside home environment.  Patient has noted restlessness of her legs at night.  The patient  relates no unusual behavior during the night.  The patient reports a history of psychiatric problems (anxiety) . The Epworth Sleepiness Score is 4 out of 24 .  The patient relates  Cardiovascular risk factors include: none    ROS  General: (-) fever, (-) chills, (-) night sweat Nose and Sinuses: (-) nasal stuffiness or itchiness, (-) postnasal drip, (-) nosebleeds, (-) sinus trouble. Mouth and Throat: (-) sore throat, (-) hoarseness. Neck: (-) swollen glands, (-) enlarged thyroid, (-) neck pain. Respiratory: - cough, - shortness of breath, - wheezing. Neurologic: - numbness, - tingling. Psychiatric: + anxiety, - depression Sleep behavior: -sleep paralysis -hypnogogic hallucinations -dream enactment      -vivid dreams -cataplexy -night terrors -sleep walking   Current Medication: Outpatient Encounter Medications as of 01/11/2023  Medication Sig   aspirin-acetaminophen-caffeine (EXCEDRIN MIGRAINE) 250-250-65 MG tablet Take 1 tablet by mouth every 6 (six) hours as needed for headache.   buPROPion (WELLBUTRIN XL) 150 MG 24 hr tablet Take by mouth.   escitalopram (LEXAPRO) 10 MG tablet Take by mouth.   levothyroxine (SYNTHROID)  175 MCG tablet Take by mouth.   meloxicam (MOBIC) 15 MG tablet Take 15 mg by mouth daily.   oxybutynin (DITROPAN XL) 15 MG 24 hr tablet Take 15 mg by mouth at bedtime.   [DISCONTINUED] cyclobenzaprine (FLEXERIL) 10 MG tablet Take 1 tablet (10 mg total) by mouth 3 (three) times daily as needed for muscle spasms.   [DISCONTINUED] levothyroxine (SYNTHROID, LEVOTHROID) 50 MCG tablet Take 50 mcg by mouth daily before breakfast.    [DISCONTINUED] oxybutynin (DITROPAN XL) 15 MG 24 hr tablet Take 15 mg by mouth daily.   [DISCONTINUED] pregabalin (LYRICA) 50 MG capsule Take 50 mg by mouth 2 (two) times a day.   No facility-administered encounter medications on file as of 01/11/2023.    Surgical History: Past Surgical History:  Procedure Laterality Date   ABDOMINAL EXPOSURE N/A 04/12/2019   Procedure: ABDOMINAL EXPOSURE;  Surgeon: Larina Earthly, MD;  Location: [facility] OR;  Service: Vascular;  Laterality: N/A;   ANTERIOR LUMBAR FUSION N/A 04/12/2019   Procedure: Lumbar Five-Sacral One Anterior Lumbar Interbody Fusion;  Surgeon: Donalee Citrin, MD;  Location: [facility] OR;  Service: Neurosurgery;  Laterality: N/A;  Lumbar Five-Sacral One Anterior Lumbar Interbody Fusion   arm fracture Right 2003   CESAREAN SECTION  2010   CHOLECYSTECTOMY     LAMINECTOMY WITH POSTERIOR LATERAL ARTHRODESIS LEVEL 2 N/A 04/12/2019   Procedure: Lumbar Four-Sacral One Posterior Lateral Fusion;  Surgeon: Donalee Citrin, MD;  Location: [facility] OR;  Service: Neurosurgery;  Laterality: N/A;  Lumbar Four-Sacral One Posterior Lateral Fusion   TUBAL LIGATION      Medical History: Past Medical History:  Diagnosis  Date   Anxiety    Depression    Headache    Hypothyroidism    Leg pain, posterior    Down to her calf.   Low back pain    Spondylolisthesis of lumbar region    With some radiculopathy into her legs    Family History: Non contributory to the present illness  Social History: Social History   Socioeconomic History   Marital status:  Married    Spouse name: Not on file   Number of children: Not on file   Years of education: Not on file   Highest education level: Not on file  Occupational History   Not on file  Tobacco Use   Smoking status: Never   Smokeless tobacco: Never  Substance and Sexual Activity   Alcohol use: Yes    Comment: occassionally    Drug use: Never   Sexual activity: Not on file  Other Topics Concern   Not on file  Social History Narrative   Not on file   Social Determinants of Health   Financial Resource Strain: Not on file  Food Insecurity: Not on file  Transportation Needs: Not on file  Physical Activity: Not on file  Stress: Not on file  Social Connections: Not on file  Intimate Partner Violence: Not on file    Vital Signs: Blood pressure 121/82, pulse 81, resp. rate 14, height 5\' 3"  (1.6 m), weight 243 lb (110.2 kg), SpO2 97 %. Body mass index is 43.05 kg/m.   Examination: General Appearance: The patient is well-developed, well-nourished, and in no distress. Neck Circumference: 39.5 cm Skin: Gross inspection of skin unremarkable. Head: normocephalic, no gross deformities. Eyes: no gross deformities noted. ENT: ears appear grossly normal Neurologic: Alert and oriented. No involuntary movements.    STOP BANG RISK ASSESSMENT S (snore) Have you been told that you snore?     YES   T (tired) Are you often tired, fatigued, or sleepy during the day?   YES  O (obstruction) Do you stop breathing, choke, or gasp during sleep? NO   P (pressure) Do you have or are you being treated for high blood pressure? NO   B (BMI) Is your body index greater than 35 kg/m? YES   A (age) Are you 3 years old or older? NO   N (neck) Do you have a neck circumference greater than 16 inches?   NO   G (gender) Are you a female? NO   TOTAL STOP/BANG "YES" ANSWERS 3                                                               A STOP-Bang score of 2 or less is considered low risk, and a score  of 5 or more is high risk for having either moderate or severe OSA. For people who score 3 or 4, doctors may need to perform further assessment to determine how likely they are to have OSA.         EPWORTH SLEEPINESS SCALE:  Scale:  (0)= no chance of dozing; (1)= slight chance of dozing; (2)= moderate chance of dozing; (3)= high chance of dozing  Chance  Situtation    Sitting and reading: 1    Watching TV: 0    Sitting Inactive in  public: 0    As a passenger in car: 1      Lying down to rest: 1    Sitting and talking: 0    Sitting quielty after lunch: 1    In a car, stopped in traffic: 0   TOTAL SCORE:   4 out of 24    SLEEP STUDIES:  PSG (07/08/22) AHI 0.0, PLMI 17.2, recommended RLS evaluation   LABS: No results found for this or any previous visit (from the past 2160 hour(s)).  Radiology: No results found.  No results found.  No results found.    Assessment and Plan: Patient Active Problem List   Diagnosis Date Noted   Insomnia 01/11/2023   Restless leg syndrome 01/11/2023   Iron deficiency 01/11/2023   Hypercholesterolemia 11/08/2020   Major depressive disorder, recurrent, in partial remission 10/09/2019   Lumbar radiculopathy, chronic 12/14/2018   Spondylolisthesis at L5-S1 level 12/14/2018   Chronic bilateral low back pain with right-sided sciatica 12/14/2018   Anxiety 08/23/2017   1. Insomnia, unspecified type Appears multifactorial- anxiety, rumination and restless leg syndrome.  Will try sleep hygiene, active thinking.  Methods to improve your sleep habits -Keep a consistent wind down period before your bedtime to help you relax. Do not do work-related activities around bedtime.  -Go to bed only when you are sleepy.  -Get out of bed at the same time every morning, even on holidays or weekends.  -If you haven't fallen asleep after lying in bed in 20 minutes, do something boring and relaxing with minimal light expo sure outside the  bedroom. Return to bed when you are sleepy.  -While in bed, turn the clock away from you. It'll only remind you of something you probably don't want to know.  -No napping during the day, especially after 3 pm. If a nap is unavoidable, keep it under an hour.  -Your bedroom should be quiet and dark.  -Take a warm, relaxing bath or shower! A warm bath or shower prior to bedtime increasesyour body's temperature so the subsequent cooling off period can help induce sleep.  -Exercise regularly, but not tough exercise within 6 hours or bedtime.  -Avoid eating large or fatty meals near bedtime.  -Avoid caffeine after lunch.    1 - Portions taken from http://yoursleep.HipsReplacement.fr.aspx   2. Restless leg syndrome Discussed at length- will check iron levels. If normal, she has tolerated gabapentin in past. She will try that. Does not use caffeine PLMs 17.2/hr on PSG 3. Iron deficiency Will check levels - Fe+TIBC+Fer    General Counseling: I have discussed the findings of the evaluation and examination with Jody Walls.  I have also discussed any further diagnostic evaluation thatmay be needed or ordered today. Jody Walls verbalizes understanding of the findings of todays visit. We also reviewed her medications today and discussed drug interactions and side effects including but not limited excessive drowsiness and altered mental states. We also discussed that there is always a risk not just to her but also people around her. she has been encouraged to call the office with any questions or concerns that should arise related to todays visit.  Orders Placed This Encounter  Procedures   Fe+TIBC+Fer        I have personally obtained a history, evaluated the patient, evaluated pertinent data, formulated the assessment and plan and placed orders.   This patient was seen today by Emmaline Kluver, PA-C in collaboration with Dr. Freda Munro.   Yevonne Pax, MD [facility] Diplomate ABMS  Pulmonary and  Critical Care Medicine Sleep medicine

## 2023-01-12 LAB — IRON,TIBC AND FERRITIN PANEL
Ferritin: 9 ng/mL — ABNORMAL LOW (ref 15–150)
Iron Saturation: 16 % (ref 15–55)
Iron: 51 ug/dL (ref 27–159)
Total Iron Binding Capacity: 327 ug/dL (ref 250–450)
UIBC: 276 ug/dL (ref 131–425)

## 2023-03-22 NOTE — Progress Notes (Signed)
Sleep Medicine   Office Visit  Patient Name: Jody Walls DOB: Aug 16, 1981 MRN [id number]    Chief Complaint: RLS/insomnia   HISTORY OF PRESENT ILLNESS: Shantese is seen today for follow up for insomnia and RLS. The patient has been taking iron supplement  at the following dose 130mg  (2 tabs of 65mg  elemental iron) daily after labs showed low ferritin as likely cause of RLS. The patient reports symptoms have stayed the same since starting medication. Sleep quality is the same since starting medication. The Epworth Sleepiness Score is 2. The patient reports she is interested in trying medication to help symptoms as she has difficulty sleeping due to RLS. She has taken gabapentin for a back problem previously and tolerated it. She is open to trying this for RLS while continuing to boost iron.    ROS  General: (-) fever, (-) chills, (-) night sweat Nose and Sinuses: (-) nasal stuffiness or itchiness, (-) postnasal drip, (-) nosebleeds, (-) sinus trouble. Mouth and Throat: (-) sore throat, (-) hoarseness. Neck: (-) swollen glands, (-) enlarged thyroid, (-) neck pain. Respiratory: - cough, - shortness of breath, - wheezing. Neurologic: - numbness, - tingling. Psychiatric: - anxiety, - depression    Current Medication: Outpatient Encounter Medications as of 03/23/2023  Medication Sig   ferrous sulfate 325 (65 FE) MG tablet Take 325 mg by mouth 2 (two) times daily with a meal.   gabapentin (NEURONTIN) 100 MG capsule Take 1 capsule (100 mg total) by mouth at bedtime. After 1 week may increase to 200mg  for 1 week. If symptoms persist, then may increase again to 300mg .   aspirin-acetaminophen-caffeine (EXCEDRIN MIGRAINE) 250-250-65 MG tablet Take 1 tablet by mouth every 6 (six) hours as needed for headache.   buPROPion (WELLBUTRIN XL) 150 MG 24 hr tablet Take by mouth.   escitalopram (LEXAPRO) 10 MG tablet Take by mouth.   levothyroxine (SYNTHROID) 175 MCG tablet Take by mouth.   meloxicam  (MOBIC) 15 MG tablet Take 15 mg by mouth daily.   oxybutynin (DITROPAN XL) 15 MG 24 hr tablet Take 15 mg by mouth at bedtime.   No facility-administered encounter medications on file as of 03/23/2023.    Surgical History: Past Surgical History:  Procedure Laterality Date   ABDOMINAL EXPOSURE N/A 04/12/2019   Procedure: ABDOMINAL EXPOSURE;  Surgeon: Larina Earthly, MD;  Location: [facility] OR;  Service: Vascular;  Laterality: N/A;   ANTERIOR LUMBAR FUSION N/A 04/12/2019   Procedure: Lumbar Five-Sacral One Anterior Lumbar Interbody Fusion;  Surgeon: Donalee Citrin, MD;  Location: [facility] OR;  Service: Neurosurgery;  Laterality: N/A;  Lumbar Five-Sacral One Anterior Lumbar Interbody Fusion   arm fracture Right 2003   CESAREAN SECTION  2010   CHOLECYSTECTOMY     LAMINECTOMY WITH POSTERIOR LATERAL ARTHRODESIS LEVEL 2 N/A 04/12/2019   Procedure: Lumbar Four-Sacral One Posterior Lateral Fusion;  Surgeon: Donalee Citrin, MD;  Location: [facility] OR;  Service: Neurosurgery;  Laterality: N/A;  Lumbar Four-Sacral One Posterior Lateral Fusion   TUBAL LIGATION      Medical History: Past Medical History:  Diagnosis Date   Anxiety    Depression    Headache    Hypothyroidism    Leg pain, posterior    Down to her calf.   Low back pain    Spondylolisthesis of lumbar region    With some radiculopathy into her legs    Family History: Non contributory to the present illness  Social History: Social History   Socioeconomic History   Marital status:  Married    Spouse name: Not on file   Number of children: Not on file   Years of education: Not on file   Highest education level: Not on file  Occupational History   Not on file  Tobacco Use   Smoking status: Never   Smokeless tobacco: Never  Substance and Sexual Activity   Alcohol use: Yes    Comment: occassionally    Drug use: Never   Sexual activity: Not on file  Other Topics Concern   Not on file  Social History Narrative   Not on file   Social Determinants of  Health   Financial Resource Strain: Not on file  Food Insecurity: Not on file  Transportation Needs: Not on file  Physical Activity: Not on file  Stress: Not on file  Social Connections: Not on file  Intimate Partner Violence: Not on file    Vital Signs: Blood pressure 113/78, pulse 69, resp. rate 16, height 5\' 3"  (1.6 m), weight 243 lb (110.2 kg), SpO2 98 %. Body mass index is 43.05 kg/m.   Examination: General Appearance: The patient is well-developed, well-nourished, and in no distress. Neck Circumference: 39.5 cm Skin: Gross inspection of skin unremarkable. Head: normocephalic, no gross deformities. Eyes: no gross deformities noted. ENT: ears appear grossly normal Neurologic: Alert and oriented. No involuntary movements.    STOP BANG RISK ASSESSMENT S (snore) Have you been told that you snore?     YES   T (tired) Are you often tired, fatigued, or sleepy during the day?   YES  O (obstruction) Do you stop breathing, choke, or gasp during sleep? NO   P (pressure) Do you have or are you being treated for high blood pressure? NO   B (BMI) Is your body index greater than 35 kg/m? YES   A (age) Are you 25 years old or older? NO   N (neck) Do you have a neck circumference greater than 16 inches?   NO   G (gender) Are you a female? NO   TOTAL STOP/BANG "YES" ANSWERS 3                                                 A STOP-Bang score of 2 or less is considered low risk, and a score of 5 or more is high risk for having either moderate or severe OSA. For people who score 3 or 4, doctors may need to perform further assessment to determine how likely they are to have OSA.          EPWORTH SLEEPINESS SCALE:  Scale:  (0)= no chance of dozing; (1)= slight chance of dozing; (2)= moderate chance of dozing; (3)= high chance of dozing  Chance  Situtation    Sitting and reading: 0    Watching TV: 1    Sitting Inactive in public: 0    As a passenger in car: 0      Lying  down to rest: 1    Sitting and talking: 0    Sitting quielty after lunch: 0    In a car, stopped in traffic: 0   TOTAL SCORE:   2 out of 24    SLEEP STUDIES:  PSG (06/2022) AHI 0.0, PLMI 17.2, recommended RLS evaluation   LABS: Recent Results (from the past 2160 hour(s))  Fe+TIBC+Fer  Status: Abnormal   Collection Time: 01/11/23  2:39 PM  Result Value Ref Range   Total Iron Binding Capacity 327 250 - 450 ug/dL   UIBC 308 657 - 846 ug/dL   Iron 51 27 - 962 ug/dL   Iron Saturation 16 15 - 55 %   Ferritin 9 (L) 15 - 150 ng/mL    Radiology: No results found.  No results found.  No results found.    Assessment and Plan: Patient Active Problem List   Diagnosis Date Noted   Insomnia 01/11/2023   Restless leg syndrome 01/11/2023   Iron deficiency 01/11/2023   Hypercholesterolemia 11/08/2020   Major depressive disorder, recurrent, in partial remission (HCC) 10/09/2019   Lumbar radiculopathy, chronic 12/14/2018   Spondylolisthesis at L5-S1 level 12/14/2018   Chronic bilateral low back pain with right-sided sciatica 12/14/2018   Anxiety 08/23/2017       1. RLS (restless legs syndrome) Continue iron supplement and will start on gabapentin before bed and will titrate as needed. Patient advised to call if new script needed sooner than appt due to titrating. - gabapentin (NEURONTIN) 100 MG capsule; Take 1 capsule (100 mg total) by mouth at bedtime. After 1 week may increase to 200mg  for 1 week. If symptoms persist, then may increase again to 300mg .  Dispense: 90 capsule; Refill: 3  2. Insomnia, unspecified type Continue to work on sleep hygiene/active thinking techniques. May see improvement with gabapentin as well.  3. Iron deficiency Continue iron supplement, may need to recheck labs in future   General Counseling: I have discussed the findings of the evaluation and examination with Harmani.  I have also discussed any further diagnostic evaluation  thatmay be needed or ordered today. Brylie verbalizes understanding of the findings of todays visit. We also reviewed her medications today and discussed drug interactions and side effects including but not limited excessive drowsiness and altered mental states. We also discussed that there is always a risk not just to her but also people around her. she has been encouraged to call the office with any questions or concerns that should arise related to todays visit.  No orders of the defined types were placed in this encounter.       I have personally obtained a history, evaluated the patient, evaluated pertinent data, formulated the assessment and plan and placed orders.  This patient was seen by Lynn Ito, PA-C in collaboration with Dr. Freda Munro as a part of collaborative care agreement.   Yevonne Pax, MD [facility] Diplomate ABMS Pulmonary Critical Care Medicine and Sleep medicine

## 2023-03-23 ENCOUNTER — Ambulatory Visit (INDEPENDENT_AMBULATORY_CARE_PROVIDER_SITE_OTHER): Payer: BC Managed Care – PPO | Admitting: Internal Medicine

## 2023-03-23 VITALS — BP 113/78 | HR 69 | Resp 16 | Ht 63.0 in | Wt 243.0 lb

## 2023-03-23 DIAGNOSIS — G2581 Restless legs syndrome: Secondary | ICD-10-CM

## 2023-03-23 DIAGNOSIS — E611 Iron deficiency: Secondary | ICD-10-CM | POA: Diagnosis not present

## 2023-03-23 DIAGNOSIS — G4700 Insomnia, unspecified: Secondary | ICD-10-CM | POA: Diagnosis not present

## 2023-03-23 MED ORDER — GABAPENTIN 100 MG PO CAPS
100.0000 mg | ORAL_CAPSULE | Freq: Every day | ORAL | 3 refills | Status: DC
Start: 2023-03-23 — End: 2023-09-20

## 2023-03-23 NOTE — Patient Instructions (Signed)
Restless Legs Syndrome Restless legs syndrome is a condition that causes uncomfortable feelings or sensations in the legs, especially while sitting or lying down. The sensations usually cause an overwhelming urge to move the legs. The arms can also sometimes be affected. The condition can range from mild to severe. The symptoms often interfere with a person's ability to sleep. What are the causes? The cause of this condition is not known. What increases the risk? The following factors may make you more likely to develop this condition: Being older than 50. Pregnancy. Being a woman. In general, the condition is more common in women than in men. A family history of the condition. Having iron deficiency. Overuse of caffeine, nicotine, or alcohol. Certain medical conditions, such as kidney disease, Parkinson's disease, or nerve damage. Certain medicines, such as those for high blood pressure, nausea, colds, allergies, depression, and some heart conditions. What are the signs or symptoms? The main symptom of this condition is uncomfortable sensations in the legs, such as: Pulling. Tingling. Prickling. Throbbing. Crawling. Burning. Usually, the sensations: Affect both sides of the body. Are worse when you sit or lie down. Are worse at night. These may make it difficult to fall asleep. Make you have a strong urge to move your legs. Are temporarily relieved by moving your legs or standing. The arms can also be affected, but this is rare. People who have this condition often have tiredness during the day because of their lack of sleep at night. How is this diagnosed? This condition may be diagnosed based on: Your symptoms. Blood tests. In some cases, you may be monitored in a sleep lab by a specialist (a sleep study). This can detect any disruptions in your sleep. How is this treated? This condition is treated by managing the symptoms. This may include: Lifestyle changes, such as  exercising, using relaxation techniques, and avoiding caffeine, alcohol, or tobacco. Iron supplements. Medicines. Parkinson's medications may be tried first. Anti-seizure medications can also be helpful. Follow these instructions at home: General instructions Take over-the-counter and prescription medicines only as told by your health care provider. Use methods to help relieve the uncomfortable sensations, such as: Massaging your legs. Walking or stretching. Taking a cold or hot bath. Keep all follow-up visits. This is important. Lifestyle     Practice good sleep habits. For example, go to bed and get up at the same time every day. Most adults should get 7-9 hours of sleep each night. Exercise regularly. Try to get at least 30 minutes of exercise most days of the week. Practice ways of relaxing, such as yoga or meditation. Avoid caffeine and alcohol. Do not use any products that contain nicotine or tobacco. These products include cigarettes, chewing tobacco, and vaping devices, such as e-cigarettes. If you need help quitting, ask your health care provider. Where to find more information National Institute of Neurological Disorders and Stroke: www.ninds.nih.gov Contact a health care provider if: Your symptoms get worse or they do not improve with treatment. Summary Restless legs syndrome is a condition that causes uncomfortable feelings or sensations in the legs, especially while sitting or lying down. The symptoms often interfere with your ability to sleep. This condition is treated by managing the symptoms. You may need to make lifestyle changes or take medicines. This information is not intended to replace advice given to you by your health care provider. Make sure you discuss any questions you have with your health care provider. Document Revised: 04/20/2021 Document Reviewed: 04/20/2021 Elsevier Patient Education    2024 [employer]  

## 2023-07-05 ENCOUNTER — Ambulatory Visit (INDEPENDENT_AMBULATORY_CARE_PROVIDER_SITE_OTHER): Payer: BC Managed Care – PPO | Admitting: Internal Medicine

## 2023-07-05 VITALS — BP 113/83 | HR 71 | Resp 12 | Ht 63.0 in | Wt 243.0 lb

## 2023-07-05 DIAGNOSIS — G2581 Restless legs syndrome: Secondary | ICD-10-CM

## 2023-07-05 DIAGNOSIS — G4700 Insomnia, unspecified: Secondary | ICD-10-CM

## 2023-07-05 DIAGNOSIS — E611 Iron deficiency: Secondary | ICD-10-CM | POA: Diagnosis not present

## 2023-07-05 MED ORDER — ROPINIROLE HCL 0.25 MG PO TABS: ORAL_TABLET | ORAL | 3 refills | Status: AC

## 2023-07-05 NOTE — Patient Instructions (Signed)
Restless Legs Syndrome Restless legs syndrome is a condition that causes uncomfortable feelings or sensations in the legs, especially while sitting or lying down. The sensations usually cause an overwhelming urge to move the legs. The arms can also sometimes be affected. The condition can range from mild to severe. The symptoms often interfere with a person's ability to sleep. What are the causes? The cause of this condition is not known. What increases the risk? The following factors may make you more likely to develop this condition: Being older than 50. Pregnancy. Being a woman. In general, the condition is more common in women than in men. A family history of the condition. Having iron deficiency. Overuse of caffeine, nicotine, or alcohol. Certain medical conditions, such as kidney disease, Parkinson's disease, or nerve damage. Certain medicines, such as those for high blood pressure, nausea, colds, allergies, depression, and some heart conditions. What are the signs or symptoms? The main symptom of this condition is uncomfortable sensations in the legs, such as: Pulling. Tingling. Prickling. Throbbing. Crawling. Burning. Usually, the sensations: Affect both sides of the body. Are worse when you sit or lie down. Are worse at night. These may make it difficult to fall asleep. Make you have a strong urge to move your legs. Are temporarily relieved by moving your legs or standing. The arms can also be affected, but this is rare. People who have this condition often have tiredness during the day because of their lack of sleep at night. How is this diagnosed? This condition may be diagnosed based on: Your symptoms. Blood tests. In some cases, you may be monitored in a sleep lab by a specialist (a sleep study). This can detect any disruptions in your sleep. How is this treated? This condition is treated by managing the symptoms. This may include: Lifestyle changes, such as  exercising, using relaxation techniques, and avoiding caffeine, alcohol, or tobacco. Iron supplements. Medicines. Parkinson's medications may be tried first. Anti-seizure medications can also be helpful. Follow these instructions at home: General instructions Take over-the-counter and prescription medicines only as told by your health care provider. Use methods to help relieve the uncomfortable sensations, such as: Massaging your legs. Walking or stretching. Taking a cold or hot bath. Keep all follow-up visits. This is important. Lifestyle     Practice good sleep habits. For example, go to bed and get up at the same time every day. Most adults should get 7-9 hours of sleep each night. Exercise regularly. Try to get at least 30 minutes of exercise most days of the week. Practice ways of relaxing, such as yoga or meditation. Avoid caffeine and alcohol. Do not use any products that contain nicotine or tobacco. These products include cigarettes, chewing tobacco, and vaping devices, such as e-cigarettes. If you need help quitting, ask your health care provider. Where to find more information [employer] of Neurological Disorders and Stroke: [employer] Contact a health care provider if: Your symptoms get worse or they do not improve with treatment. Summary Restless legs syndrome is a condition that causes uncomfortable feelings or sensations in the legs, especially while sitting or lying down. The symptoms often interfere with your ability to sleep. This condition is treated by managing the symptoms. You may need to make lifestyle changes or take medicines. This information is not intended to replace advice given to you by your health care provider. Make sure you discuss any questions you have with your health care provider. Document Revised: 04/20/2021 Document Reviewed: 04/20/2021 Elsevier Patient Education  2024 Elsevier Inc.

## 2023-07-05 NOTE — Progress Notes (Unsigned)
Sleep Medicine   Office Visit  Patient Name: Jody Walls DOB: March 28, 1981 MRN [id number]    Chief Complaint: f/u RLS   HISTORY OF PRESENT ILLNESS: Jody Walls is seen today for a 3 month follow up for insomnia and RLS. The patient has been taking gabapentin   at the following dose  400 mg nightly.  She is also on an iron supplement.  The patient reports symptoms have not improved since starting medication. Sleep quality is poor since starting medication and the patient has daytime sleepiness. The Epworth Sleepiness Score is 2. The patient reports no side effects from the medication.   ROS  General: (-) fever, (-) chills, (-) night sweat Nose and Sinuses: (-) nasal stuffiness or itchiness, (-) postnasal drip, (-) nosebleeds, (-) sinus trouble. Mouth and Throat: (-) sore throat, (-) hoarseness. Neck: (-) swollen glands, (-) enlarged thyroid, (-) neck pain. Respiratory: - cough, - shortness of breath, - wheezing. Neurologic: - numbness, - tingling. Psychiatric: + anxiety, + depression Sleep behavior: -sleep paralysis -hypnogogic hallucinations -dream enactment      -vivid dreams -cataplexy -night terrors -sleep walking   Current Medication: Outpatient Encounter Medications as of 07/05/2023  Medication Sig   aspirin-acetaminophen-caffeine (EXCEDRIN MIGRAINE) 250-250-65 MG tablet Take 1 tablet by mouth every 6 (six) hours as needed for headache.   buPROPion (WELLBUTRIN XL) 150 MG 24 hr tablet Take by mouth.   escitalopram (LEXAPRO) 10 MG tablet Take by mouth.   ferrous sulfate 325 (65 FE) MG tablet Take 325 mg by mouth 2 (two) times daily with a meal.   gabapentin (NEURONTIN) 100 MG capsule Take 1 capsule (100 mg total) by mouth at bedtime. After 1 week may increase to 200mg  for 1 week. If symptoms persist, then may increase again to 300mg .   levothyroxine (SYNTHROID) 175 MCG tablet Take by mouth.   meloxicam (MOBIC) 15 MG tablet Take 15 mg by mouth daily.   oxybutynin (DITROPAN XL)  15 MG 24 hr tablet Take 15 mg by mouth at bedtime.   No facility-administered encounter medications on file as of 07/05/2023.    Surgical History: Past Surgical History:  Procedure Laterality Date   ABDOMINAL EXPOSURE N/A 04/12/2019   Procedure: ABDOMINAL EXPOSURE;  Surgeon: Larina Earthly, MD;  Location: [facility] OR;  Service: Vascular;  Laterality: N/A;   ANTERIOR LUMBAR FUSION N/A 04/12/2019   Procedure: Lumbar Five-Sacral One Anterior Lumbar Interbody Fusion;  Surgeon: Donalee Citrin, MD;  Location: [facility] OR;  Service: Neurosurgery;  Laterality: N/A;  Lumbar Five-Sacral One Anterior Lumbar Interbody Fusion   arm fracture Right 2003   CESAREAN SECTION  2010   CHOLECYSTECTOMY     LAMINECTOMY WITH POSTERIOR LATERAL ARTHRODESIS LEVEL 2 N/A 04/12/2019   Procedure: Lumbar Four-Sacral One Posterior Lateral Fusion;  Surgeon: Donalee Citrin, MD;  Location: [facility] OR;  Service: Neurosurgery;  Laterality: N/A;  Lumbar Four-Sacral One Posterior Lateral Fusion   TUBAL LIGATION      Medical History: Past Medical History:  Diagnosis Date   Anxiety    Depression    Headache    Hypothyroidism    Leg pain, posterior    Down to her calf.   Low back pain    Spondylolisthesis of lumbar region    With some radiculopathy into her legs    Family History: Non contributory to the present illness  Social History: Social History   Socioeconomic History   Marital status: Married    Spouse name: Not on file   Number of children: Not  on file   Years of education: Not on file   Highest education level: Not on file  Occupational History   Not on file  Tobacco Use   Smoking status: Never   Smokeless tobacco: Never  Substance and Sexual Activity   Alcohol use: Yes    Comment: occassionally    Drug use: Never   Sexual activity: Not on file  Other Topics Concern   Not on file  Social History Narrative   Not on file   Social Determinants of Health   Financial Resource Strain: Low Risk  (11/26/2022)   Received  from [facility], [facility]   Overall Financial Resource Strain (CARDIA)    Difficulty of Paying Living Expenses: Not very hard  Food Insecurity: No Food Insecurity (11/26/2022)   Received from [facility], [facility]   Hunger Vital Sign    Worried About Running Out of Food in the Last Year: Never true    Ran Out of Food in the Last Year: Never true  Transportation Needs: No Transportation Needs (11/26/2022)   Received from [facility], [facility]   [facility] - Transportation    In the past 12 months, has lack of transportation kept you from medical appointments or from getting medications?: No    Lack of Transportation (Non-Medical): No  Physical Activity: Not on file  Stress: Not on file  Social Connections: Not on file  Intimate Partner Violence: Not on file    Vital Signs: There were no vitals taken for this visit. There is no height or weight on file to calculate BMI.   Examination: General Appearance: The patient is well-developed, well-nourished, and in no distress. Neck Circumference: 42 cm Skin: Gross inspection of skin unremarkable. Head: normocephalic, no gross deformities. Eyes: no gross deformities noted. ENT: ears appear grossly normal Neurologic: Alert and oriented. No involuntary movements.    STOP BANG RISK ASSESSMENT S (snore) Have you been told that you snore?     YES   T (tired) Are you often tired, fatigued, or sleepy during the day?   YES  O (obstruction) Do you stop breathing, choke, or gasp during sleep? NO   P (pressure) Do you have or are you being treated for high blood pressure? NO   B (BMI) Is your body index greater than 35 kg/m? YES   A (age) Are you 23 years old or older? NO   N (neck) Do you have a neck circumference greater than 16 inches?   YES   G (gender) Are you a female? NO   TOTAL STOP/BANG "YES" ANSWERS 4                                                  A STOP-Bang score of 2 or less is considered low risk, and a score of 5 or more is high risk for having either moderate or severe OSA. For people who score 3 or 4, doctors may need to perform further assessment to determine how likely they are to have OSA.          EPWORTH SLEEPINESS SCALE:  Scale:  (0)= no chance of dozing; (1)= slight chance of dozing; (2)= moderate chance of dozing; (3)= high chance of dozing  Chance  Situtation    Sitting  and reading: 0    Watching TV: 1    Sitting Inactive in public: 0    As a passenger in car: 0      Lying down to rest: 1    Sitting and talking: 0    Sitting quielty after lunch: 0    In a car, stopped in traffic: 0   TOTAL SCORE:   2 out of 24    SLEEP STUDIES:  PSG (06/2022) AHI 0.0, PLMI 17.2, recommended RLS evaluation    LABS: No results found for this or any previous visit (from the past 2160 hour(s)).  Radiology: No results found.  No results found.  No results found.    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 RLS (restless legs syndrome) Gabapentin has not been helpful. She will wean off that and we will try ropinirole. F/u 16m Trial magnesium glycinate and l-theanine if needed.   2. Insomnia, unspecified type Consider cbti, sleep hygiene.  Methods to improve your sleep habits -Keep a consistent wind down period before your bedtime to help you relax. Do not do work-related activities around bedtime.  -Go to bed only when you are sleepy.  -Get out of bed at the  same time every morning, even on holidays or weekends.  -If you haven't fallen asleep after lying in bed in 20 minutes, do something boring and relaxing with minimal light expo sure outside the bedroom. Return to bed when you are sleepy.  -While in bed, turn the clock away from you. It'll only remind you of something you probably don't want to know.  -No napping during the day, especially after 3 pm. If a nap is unavoidable, keep it under an hour.  -Your bedroom should be quiet and dark.  -Take a warm, relaxing bath or shower! A warm bath or shower prior to bedtime increasesyour body's temperature so the subsequent cooling off period can help induce sleep.  -Exercise regularly, but not tough exercise within 6 hours or bedtime.  -Avoid eating large or fatty meals near bedtime.  -Avoid caffeine after lunch.    1 - Portions taken from http://yoursleep.HipsReplacement.fr.aspx   3. Iron deficiency Will recheck - Fe+TIBC+Fer   General Counseling: I have discussed the findings of the evaluation and examination with Jody Walls.  I have also discussed any further diagnostic evaluation thatmay be needed or ordered today. Jody Walls verbalizes understanding of the findings of todays visit. We also reviewed her medications today and discussed drug interactions and side effects including but not limited excessive drowsiness and altered mental states. We also discussed that there is always a risk not just to her but also people around her. she has been encouraged to call the office with any questions or concerns that should arise related to todays visit.  No orders of the defined types were placed in this encounter.       I have personally obtained a history, evaluated the patient, evaluated pertinent data, formulated the assessment and plan and placed orders.  This patient was seen today by Emmaline Kluver, PA-C in collaboration with Dr. Freda Munro.   Yevonne Pax,  MD [facility] Diplomate ABMS  Pulmonary Critical Care Medicine and Sleep medicine

## 2023-09-17 NOTE — Progress Notes (Unsigned)
Sleep Medicine   Office Visit  Patient Name: Jody Walls DOB: [birth year] MRN [id number]    Chief Complaint: ***   HISTORY OF PRESENT ILLNESS: Jody Walls is seen today for a 2 month follow up visit for insomnia and RLS.  The patient has been taking Gabapentin at the following dose  400 mg nightly. The patient reports symptoms have *** since starting medication. Sleep quality is *** since starting medication and the patient *** daytime sleepiness. The Epworth Sleepiness Score is ***. The patient reports *** side effects from the medication.   ROS  General: (-) fever, (-) chills, (-) night sweat Nose and Sinuses: (-) nasal stuffiness or itchiness, (-) postnasal drip, (-) nosebleeds, (-) sinus trouble. Mouth and Throat: (-) sore throat, (-) hoarseness. Neck: (-) swollen glands, (-) enlarged thyroid, (-) neck pain. Respiratory: *** cough, *** shortness of breath, *** wheezing. Neurologic: *** numbness, *** tingling. Psychiatric: *** anxiety, *** depression Sleep behavior: ***sleep paralysis ***hypnogogic hallucinations ***dream enactment      ***vivid dreams ***cataplexy ***night terrors ***sleep walking   Current Medication: Outpatient Encounter Medications as of 09/20/2023  Medication Sig   aspirin-acetaminophen-caffeine (EXCEDRIN MIGRAINE) 250-250-65 MG tablet Take 1 tablet by mouth every 6 (six) hours as needed for headache.   buPROPion (WELLBUTRIN XL) 150 MG 24 hr tablet Take by mouth.   escitalopram (LEXAPRO) 10 MG tablet Take by mouth.   ferrous sulfate 325 (65 FE) MG tablet Take 325 mg by mouth 2 (two) times daily with a meal.   gabapentin (NEURONTIN) 100 MG capsule Take 1 capsule (100 mg total) by mouth at bedtime. After 1 week may increase to 200mg  for 1 week. If symptoms persist, then may increase again to 300mg .   levothyroxine (SYNTHROID) 200 MCG tablet Take by mouth.   meloxicam (MOBIC) 15 MG tablet Take 15 mg by mouth daily.   oxybutynin (DITROPAN XL) 15 MG 24 hr  tablet Take 15 mg by mouth at bedtime.   rOPINIRole (REQUIP) 0.25 MG tablet Take one two hours before bedtime, may increase to two after two weeks.   No facility-administered encounter medications on file as of 09/20/2023.    Surgical History: Past Surgical History:  Procedure Laterality Date   ABDOMINAL EXPOSURE N/A 04/12/2019   Procedure: ABDOMINAL EXPOSURE;  Surgeon: Larina Earthly, MD;  Location[facility] OR;  Service: Vascular;  Laterality: N/A;   ANTERIOR LUMBAR FUSION N/A 04/12/2019   Procedure: Lumbar Five-Sacral One Anterior Lumbar Interbody Fusion;  Surgeon: Donalee Citrin, MD;  Location: [facility] OR;  Service: Neurosurgery;  Laterality: N/A;  Lumbar Five-Sacral One Anterior Lumbar Interbody Fusion   arm fracture Right 2003   CESAREAN SECTION  2010   CHOLECYSTECTOMY     LAMINECTOMY WITH POSTERIOR LATERAL ARTHRODESIS LEVEL 2 N/A 04/12/2019   Procedure: Lumbar Four-Sacral One Posterior Lateral Fusion;  Surgeon: Donalee Citrin, MD;  Location: [facility] OR;  Service: Neurosurgery;  Laterality: N/A;  Lumbar Four-Sacral One Posterior Lateral Fusion   TUBAL LIGATION      Medical History: Past Medical History:  Diagnosis Date   Anxiety    Depression    Headache    Hypothyroidism    Leg pain, posterior    Down to her calf.   Low back pain    Spondylolisthesis of lumbar region    With some radiculopathy into her legs    Family History: Non contributory to the present illness  Social History: Social History   Socioeconomic History   Marital status: Married    Spouse name:  Not on file   Number of children: Not on file   Years of education: Not on file   Highest education level: Not on file  Occupational History   Not on file  Tobacco Use   Smoking status: Never   Smokeless tobacco: Never  Substance and Sexual Activity   Alcohol use: Yes    Comment: occassionally    Drug use: Never   Sexual activity: Not on file  Other Topics Concern   Not on file  Social History Narrative   Not on file    Social Drivers of Health   Financial Resource Strain: Low Risk  (11/26/2022)   Received from [facility], [facility]   Overall Financial Resource Strain (CARDIA)    Difficulty of Paying Living Expenses: Not very hard  Food Insecurity: No Food Insecurity (11/26/2022)   Received from [facility], [facility]   Hunger Vital Sign    Worried About Running Out of Food in the Last Year: Never true    Ran Out of Food in the Last Year: Never true  Transportation Needs: No Transportation Needs (11/26/2022)   Received from [facility], [facility]   [facility] - Transportation    In the past 12 months, has lack of transportation kept you from medical appointments or from getting medications?: No    Lack of Transportation (Non-Medical): No  Physical Activity: Not on file  Stress: Not on file  Social Connections: Not on file  Intimate Partner Violence: Not on file    Vital Signs: There were no vitals taken for this visit. There is no height or weight on file to calculate BMI.   Examination: General Appearance: The patient is well-developed, well-nourished, and in no distress. Neck Circumference: 42 cm Skin: Gross inspection of skin unremarkable. Head: normocephalic, no gross deformities. Eyes: no gross deformities noted. ENT: ears appear grossly normal Neurologic: Alert and oriented. No involuntary movements.    STOP BANG RISK ASSESSMENT S (snore) Have you been told that you snore?     YES   T (tired) Are you often tired, fatigued, or sleepy during the day?   YES  O (obstruction) Do you stop breathing, choke, or gasp during sleep? NO   P (pressure) Do you have or are you being treated for high blood pressure? NO   B (BMI) Is your body index greater than 35 kg/m? YES   A (age) Are you 39 years old or older? NO   N (neck) Do you have a neck circumference greater than 16 inches?    YES   G (gender) Are you a female? NO   TOTAL STOP/BANG "YES" ANSWERS                                                  A STOP-Bang score of 2 or less is considered low risk, and a score of 5 or more is high risk for having either moderate or severe OSA. For people who score 3 or 4, doctors may need to perform further assessment to determine how likely they are to have OSA.          EPWORTH SLEEPINESS SCALE:  Scale:  (0)= no chance of dozing; (1)= slight chance of dozing; (2)= moderate chance of dozing; (3)= high chance of  dozing  Chance  Situtation    Sitting and reading: ***    Watching TV: ***    Sitting Inactive in public: ***    As a passenger in car: ***      Lying down to rest: ***    Sitting and talking: ***    Sitting quielty after lunch: ***    In a car, stopped in traffic: ***   TOTAL SCORE:   *** out of 24    SLEEP STUDIES:  PSG (06/2022) AHI 0/hr, PLMI 17.2/hr, recommended RLS evaluation.   LABS: No results found for this or any previous visit (from the past 2160 hours).  Radiology: No results found.  No results found.  No results found.    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  Suggest: *** to assess/treat the patient's sleep disordered breathing. The patient was also counselled on *** to optimize  sleep health.  PLAN hypersomnia:  Patient evaluation suggests significant daytime hypersomnia.  The Epworth Sleepiness Score is elevated at *** out of 24. Patient *** drowsy driving. The patient *** MVA due to sleepiness.  The patient *** restless leg symptoms which exacerbate *** for *** nights per week. The patient *** periodic limb movements which exacerbate ***  for *** nights per week. Suggest: ***  Also suggest ***  PLAN insomnia:  Patient evaluation suggests *** insomnia. This is a chronic disorder. This has been a concern for *** and causes impaired daytime functioning. The patient exhibits comorbid ***  The history *** suggest the insomnia predates the use of hypnotic medications. The symptoms *** with the discontinuation of these medications. There is no obvious medical, psychiatric or pharmacologic abuse issues ot account for the insomnia.  Treatment recommendations include: *** The patient should maintain a sleep log and calculate total sleep time for 1-2 weeks. Set bed and wake times for achieve 85% sleep efficiency for one week. Once this is achieved  time in bed can be gradually increased. A pharmacologic treatment approach would include a trial of *** for the next ***  months. During this time the patient is to maintain a sleep diary to track progress.    ***  General Counseling: I have discussed the findings of the evaluation and examination with Edgar.  I have also discussed any further diagnostic evaluation thatmay be needed or ordered today. Izzabell verbalizes understanding of the findings of todays visit. We also reviewed her medications today and discussed drug interactions and side effects including but not limited excessive drowsiness and altered mental states. We also discussed that there is always a risk not just to her but also people around her. she has been encouraged to call the office with any questions or concerns that should arise related to todays visit.  No  orders of the defined types were placed in this encounter.       I have personally obtained a history, evaluated the patient,  evaluated pertinent data, formulated the assessment and plan and placed orders.   Yevonne Pax, MD [facility] Diplomate ABMS Pulmonary Critical Care Medicine and Sleep medicine

## 2023-09-20 ENCOUNTER — Ambulatory Visit (INDEPENDENT_AMBULATORY_CARE_PROVIDER_SITE_OTHER): Payer: BC Managed Care – PPO | Admitting: Internal Medicine

## 2023-09-20 VITALS — BP 114/78 | HR 84 | Resp 16 | Ht 63.0 in | Wt 254.0 lb

## 2023-09-20 DIAGNOSIS — G4700 Insomnia, unspecified: Secondary | ICD-10-CM

## 2023-09-20 DIAGNOSIS — G2581 Restless legs syndrome: Secondary | ICD-10-CM

## 2023-09-20 MED ORDER — ROPINIROLE HCL 5 MG PO TABS: ORAL_TABLET | ORAL | 1 refills | Status: AC

## 2024-03-23 ENCOUNTER — Encounter: Payer: Self-pay | Admitting: Gastroenterology

## 2024-04-13 ENCOUNTER — Encounter: Payer: Self-pay | Admitting: Gastroenterology

## 2024-04-14 ENCOUNTER — Encounter
Admission: RE | Disposition: A | Discharge status: Home / Self Care | Payer: Self-pay | Source: Home / Self Care | Attending: Gastroenterology

## 2024-04-14 ENCOUNTER — Encounter: Payer: Self-pay | Admitting: Gastroenterology

## 2024-04-14 ENCOUNTER — Ambulatory Visit
Admission: RE | Admit: 2024-04-14 | Discharge: 2024-04-14 | Disposition: A | Discharge status: Home / Self Care | Attending: Gastroenterology | Admitting: Gastroenterology

## 2024-04-14 ENCOUNTER — Other Ambulatory Visit: Payer: Self-pay

## 2024-04-14 ENCOUNTER — Ambulatory Visit: Admitting: Anesthesiology

## 2024-04-14 DIAGNOSIS — Z83719 Family history of colon polyps, unspecified: Secondary | ICD-10-CM | POA: Insufficient documentation

## 2024-04-14 DIAGNOSIS — Z800 Family history of malignant neoplasm of digestive organs: Secondary | ICD-10-CM | POA: Diagnosis not present

## 2024-04-14 DIAGNOSIS — Z1211 Encounter for screening for malignant neoplasm of colon: Secondary | ICD-10-CM | POA: Diagnosis present

## 2024-04-14 DIAGNOSIS — E66813 Obesity, class 3: Secondary | ICD-10-CM | POA: Diagnosis not present

## 2024-04-14 DIAGNOSIS — K640 First degree hemorrhoids: Secondary | ICD-10-CM | POA: Diagnosis not present

## 2024-04-14 DIAGNOSIS — Z6841 Body Mass Index (BMI) 40.0 and over, adult: Secondary | ICD-10-CM | POA: Diagnosis not present

## 2024-04-14 HISTORY — PX: COLONOSCOPY: SHX5424

## 2024-04-14 HISTORY — DX: Hyperglycemia, unspecified: R73.9

## 2024-04-14 HISTORY — DX: Pure hypercholesterolemia, unspecified: E78.00

## 2024-04-14 LAB — POCT PREGNANCY, URINE: Preg Test, Ur: NEGATIVE

## 2024-04-14 SURGERY — COLONOSCOPY
Anesthesia: General

## 2024-04-14 MED ORDER — PROPOFOL 10 MG/ML IV BOLUS
INTRAVENOUS | Status: DC | PRN
  Administered 2024-04-14 (×2): 50 mg via INTRAVENOUS

## 2024-04-14 MED ORDER — DEXMEDETOMIDINE HCL IN NACL 80 MCG/20ML IV SOLN
INTRAVENOUS | Status: DC | PRN
Start: 2024-04-14 — End: 2024-04-14
  Administered 2024-04-14: 20 ug via INTRAVENOUS

## 2024-04-14 MED ORDER — PROPOFOL 500 MG/50ML IV EMUL
INTRAVENOUS | Status: DC | PRN
Start: 2024-04-14 — End: 2024-04-14
  Administered 2024-04-14: 75 ug/kg/min via INTRAVENOUS

## 2024-04-14 MED ORDER — LIDOCAINE HCL (CARDIAC) PF 100 MG/5ML IV SOSY
PREFILLED_SYRINGE | INTRAVENOUS | Status: DC | PRN
  Administered 2024-04-14: 80 mg via INTRAVENOUS

## 2024-04-14 MED ORDER — LIDOCAINE HCL (PF) 2 % IJ SOLN
INTRAMUSCULAR | Status: AC
  Filled 2024-04-14: qty 5

## 2024-04-14 MED ORDER — SODIUM CHLORIDE 0.9 % IV SOLN: INTRAVENOUS | Status: DC

## 2024-04-14 NOTE — Anesthesia Postprocedure Evaluation (Signed)
 Anesthesia Post Note  Patient: Jody Walls  Procedure(s) Performed: COLONOSCOPY  Patient location during evaluation: Endoscopy Anesthesia Type: General Level of consciousness: awake and alert Pain management: pain level controlled Vital Signs Assessment: post-procedure vital signs reviewed and stable Respiratory status: spontaneous breathing, nonlabored ventilation and respiratory function stable Cardiovascular status: blood pressure returned to baseline and stable Postop Assessment: no apparent nausea or vomiting Anesthetic complications: no   No notable events documented.   Last Vitals:  Vitals:   04/14/24 0952 04/14/24 1002  BP: 118/89 102/73  Pulse: 98 65  Resp:    Temp:    SpO2: 100% 98%    Last Pain:  Vitals:   04/14/24 1002  TempSrc:   PainSc: 0-No pain                 Fairy POUR Raynie Steinhaus

## 2024-04-14 NOTE — Anesthesia Preprocedure Evaluation (Signed)
 Anesthesia Evaluation  Patient identified by MRN, date of birth, ID band Patient awake    Reviewed: Allergy & Precautions, NPO status , Patient's Chart, lab work & pertinent test results  History of Anesthesia Complications Negative for: history of anesthetic complications  Airway Mallampati: III  TM Distance: <3 FB Neck ROM: full    Dental  (+) Chipped   Pulmonary neg pulmonary ROS, neg shortness of breath   Pulmonary exam normal        Cardiovascular Exercise Tolerance: Good negative cardio ROS Normal cardiovascular exam     Neuro/Psych  Headaches PSYCHIATRIC DISORDERS       Neuromuscular disease    GI/Hepatic negative GI ROS, Neg liver ROS,neg GERD  ,,  Endo/Other  Hypothyroidism  Class 3 obesity  Renal/GU negative Renal ROS  negative genitourinary   Musculoskeletal   Abdominal   Peds  Hematology negative hematology ROS (+)   Anesthesia Other Findings Past Medical History: No date: Anxiety No date: Depression No date: Elevated blood sugar No date: Headache No date: Hypercholesterolemia No date: Hypothyroidism No date: Leg pain, posterior     Comment:  Down to her calf. No date: Low back pain No date: Spondylolisthesis of lumbar region     Comment:  With some radiculopathy into her legs  Past Surgical History: 04/12/2019: ABDOMINAL EXPOSURE; N/A     Comment:  Procedure: ABDOMINAL EXPOSURE;  Surgeon: Oris Krystal FALCON,               MD;  Location: [facility] OR;  Service: Vascular;  Laterality:               N/A; 04/12/2019: ANTERIOR LUMBAR FUSION; N/A     Comment:  Procedure: Lumbar Five-Sacral One Anterior Lumbar               Interbody Fusion;  Surgeon: Onetha Kuba, MD;  Location: [facility]              OR;  Service: Neurosurgery;  Laterality: N/A;  Lumbar               Five-Sacral One Anterior Lumbar Interbody Fusion 2003: arm fracture; Right No date: BACK SURGERY 2010: CESAREAN SECTION No date:  CHOLECYSTECTOMY 04/12/2019: LAMINECTOMY WITH POSTERIOR LATERAL ARTHRODESIS LEVEL 2; N/ A     Comment:  Procedure: Lumbar Four-Sacral One Posterior Lateral               Fusion;  Surgeon: Onetha Kuba, MD;  Location: [facility] OR;                Service: Neurosurgery;  Laterality: N/A;  Lumbar               Four-Sacral One Posterior Lateral Fusion No date: TUBAL LIGATION  BMI    Body Mass Index: 44.11 kg/m      Reproductive/Obstetrics negative OB ROS                              Anesthesia Physical Anesthesia Plan  ASA: 3  Anesthesia Plan: General   Post-op Pain Management:    Induction: Intravenous  PONV Risk Score and Plan: Propofol  infusion and TIVA  Airway Management Planned: Natural Airway and Nasal Cannula  Additional Equipment:   Intra-op Plan:   Post-operative Plan:   Informed Consent: I have reviewed the patients History and Physical, chart, labs and discussed the procedure including the risks, benefits and alternatives for the proposed anesthesia with the  patient or authorized representative who has indicated his/her understanding and acceptance.     Dental Advisory Given  Plan Discussed with: Anesthesiologist, CRNA and Surgeon  Anesthesia Plan Comments: (Patient consented for risks of anesthesia including but not limited to:  - adverse reactions to medications - risk of airway placement if required - damage to eyes, teeth, lips or other oral mucosa - nerve damage due to positioning  - sore throat or hoarseness - Damage to heart, brain, nerves, lungs, other parts of body or loss of life  Patient voiced understanding and assent.)        Anesthesia Quick Evaluation

## 2024-04-14 NOTE — Interval H&P Note (Signed)
 History and Physical Interval Note: Preprocedure H&P from 04/14/24  was reviewed and there was no interval change after seeing and examining the patient.  Written consent was obtained from the patient after discussion of risks, benefits, and alternatives. Patient has consented to proceed with Colonoscopy with possible intervention   04/14/2024 9:11 AM  Jody Walls  has presented today for surgery, with the diagnosis of Family history of colon cancer (Z80.0) Colon cancer screening (Z12.11) Family history of colonic polyps (Z83.719).  The various methods of treatment have been discussed with the patient and family. After consideration of risks, benefits and other options for treatment, the patient has consented to  Procedure(s): COLONOSCOPY (N/A) as a surgical intervention.  The patient's history has been reviewed, patient examined, no change in status, stable for surgery.  I have reviewed the patient's chart and labs.  Questions were answered to the patient's satisfaction.     Elspeth Ozell Jungling

## 2024-04-14 NOTE — Op Note (Signed)
 [facility] Gastroenterology Patient Name: Jody Walls Procedure Date: 04/14/2024 8:59 AM MRN: [id number] Account #: [id number] Date of Birth: [birth year] Admit Type: Outpatient Age: 43 Room: [facility] ENDO ROOM 1 Gender: Female Note Status: Finalized Instrument Name: Arvis [id number] Procedure:             Colonoscopy Indications:           Screening in patient at increased risk: Family history                         of 1st-degree relative with colorectal cancer before                         age 25 years Providers:             Elspeth Ozell Onita ROSALEA, DO Referring MD:          Elspeth Ozell Onita DO, DO (Referring MD), Jerona CHARLENA Sayre MD (Referring MD) Medicines:             Monitored Anesthesia Care Complications:         No immediate complications. Estimated blood loss: None. Procedure:             Pre-Anesthesia Assessment:                        - Prior to the procedure, a History and Physical was                         performed, and patient medications and allergies were                         reviewed. The patient is competent. The risks and                         benefits of the procedure and the sedation options and                         risks were discussed with the patient. All questions                         were answered and informed consent was obtained.                         Patient identification and proposed procedure were                         verified by the physician, the nurse, the anesthetist                         and the technician in the endoscopy suite. Mental                         Status Examination: alert and oriented. Airway                         Examination: normal oropharyngeal airway and neck  mobility. Respiratory Examination: clear to                         auscultation. CV Examination: RRR, no murmurs, no S3                         or S4. Prophylactic  Antibiotics: The patient does not                         require prophylactic antibiotics. Prior                         Anticoagulants: The patient has taken no anticoagulant                         or antiplatelet agents. ASA Grade Assessment: III - A                         patient with severe systemic disease. After reviewing                         the risks and benefits, the patient was deemed in                         satisfactory condition to undergo the procedure. The                         anesthesia plan was to use monitored anesthesia care                         (MAC). Immediately prior to administration of                         medications, the patient was re-assessed for adequacy                         to receive sedatives. The heart rate, respiratory                         rate, oxygen saturations, blood pressure, adequacy of                         pulmonary ventilation, and response to care were                         monitored throughout the procedure. The physical                         status of the patient was re-assessed after the                         procedure.                        After obtaining informed consent, the colonoscope was                         passed under direct vision. Throughout the procedure,  the patient's blood pressure, pulse, and oxygen                         saturations were monitored continuously. The                         Colonoscope was introduced through the anus and                         advanced to the the terminal ileum, with                         identification of the appendiceal orifice and IC                         valve. The colonoscopy was performed without                         difficulty. The patient tolerated the procedure well.                         The quality of the bowel preparation was evaluated                         using the BBPS [facility] Bowel Preparation Scale) with                          scores of: Right Colon = 3, Transverse Colon = 3 and                         Left Colon = 3 (entire mucosa seen well with no                         residual staining, small fragments of stool or opaque                         liquid). The total BBPS score equals 9. The terminal                         ileum, ileocecal valve, appendiceal orifice, and                         rectum were photographed. Findings:      The perianal and digital rectal examinations were normal. Pertinent       negatives include normal sphincter tone.      The terminal ileum appeared normal. Estimated blood loss: none.      Retroflexion in the right colon was performed.      Non-bleeding internal hemorrhoids were found during retroflexion. The       hemorrhoids were Grade I (internal hemorrhoids that do not prolapse).      The entire examined colon appeared normal on direct and retroflexion       views. Impression:            - The examined portion of the ileum was normal.                        - Non-bleeding internal hemorrhoids.                        -  The entire examined colon is normal on direct and                         retroflexion views.                        - No specimens collected. Recommendation:        - Patient has a contact number available for                         emergencies. The signs and symptoms of potential                         delayed complications were discussed with the patient.                         Return to normal activities tomorrow. Written                         discharge instructions were provided to the patient.                        - Discharge patient to home.                        - Resume previous diet.                        - Continue present medications.                        - Repeat colonoscopy in 5 years for screening purposes.                        - Return to referring physician as previously                         scheduled.                         - The findings and recommendations were discussed with                         the patient. Procedure Code(s):     --- Professional ---                        [phone], Colonoscopy, flexible; diagnostic, including                         collection of specimen(s) by brushing or washing, when                         performed (separate procedure) Diagnosis Code(s):     --- Professional ---                        Z80.0, Family history of malignant neoplasm of                         digestive organs  K64.0, First degree hemorrhoids CPT copyright 2022 American Medical Association. All rights reserved. The codes documented in this report are preliminary and upon coder review may  be revised to meet current compliance requirements. Attending Participation:      I personally performed the entire procedure. Elspeth Jungling, DO Elspeth Ozell Jungling DO, DO 04/14/2024 9:46:08 AM This report has been signed electronically. Number of Addenda: 0 Note Initiated On: 04/14/2024 8:59 AM Scope Withdrawal Time: 0 hours 13 minutes 3 seconds  Total Procedure Duration: 0 hours 15 minutes 25 seconds  Estimated Blood Loss:  Estimated blood loss: none.      [facility]

## 2024-04-14 NOTE — Transfer of Care (Signed)
 Immediate Anesthesia Transfer of Care Note  Patient: Jody Walls  Procedure(s) Performed: COLONOSCOPY  Patient Location: PACU  Anesthesia Type:General  Level of Consciousness: sedated  Airway & Oxygen Therapy: Patient Spontanous Breathing  Post-op Assessment: Report given to RN and Post -op Vital signs reviewed and stable  Post vital signs: Reviewed and stable  Last Vitals:  Vitals Value Taken Time  BP    Temp    Pulse    Resp    SpO2      Last Pain:  Vitals:   04/14/24 0816  TempSrc: Temporal  PainSc: 0-No pain         Complications: No notable events documented.

## 2024-04-14 NOTE — H&P (Signed)
 Pre-Procedure H&P   Patient ID: Jody Walls is a 43 y.o. female.  Gastroenterology Provider: Elspeth Ozell Jungling, DO  Referring Provider: Dr. Diedra PCP: Diedra Lame, MD  Date: 04/14/2024  HPI Jody Walls is a 43 y.o. female who presents today for Colonoscopy for Colorectal cancer screening-family history of colon cancer .  Initial screening  Patient with a family history of colon cancer in her father in his 56s.  Brother with history of colon polyps at age 52.  She reports regular bowel movements without melena or hematochezia  Hemoglobin 15.4 MCV 90.5 Platelets 325,000  Status post lumbar fusion C-section and cholecystectomy   Past Medical History:  Diagnosis Date   Anxiety    Depression    Elevated blood sugar    Headache    Hypercholesterolemia    Hypothyroidism    Leg pain, posterior    Down to her calf.   Low back pain    Spondylolisthesis of lumbar region    With some radiculopathy into her legs    Past Surgical History:  Procedure Laterality Date   ABDOMINAL EXPOSURE N/A 04/12/2019   Procedure: ABDOMINAL EXPOSURE;  Surgeon: Oris Krystal FALCON, MD;  Location: [facility] OR;  Service: Vascular;  Laterality: N/A;   ANTERIOR LUMBAR FUSION N/A 04/12/2019   Procedure: Lumbar Five-Sacral One Anterior Lumbar Interbody Fusion;  Surgeon: Onetha Kuba, MD;  Location: [facility] OR;  Service: Neurosurgery;  Laterality: N/A;  Lumbar Five-Sacral One Anterior Lumbar Interbody Fusion   arm fracture Right 2003   BACK SURGERY     CESAREAN SECTION  2010   CHOLECYSTECTOMY     LAMINECTOMY WITH POSTERIOR LATERAL ARTHRODESIS LEVEL 2 N/A 04/12/2019   Procedure: Lumbar Four-Sacral One Posterior Lateral Fusion;  Surgeon: Onetha Kuba, MD;  Location: [facility] OR;  Service: Neurosurgery;  Laterality: N/A;  Lumbar Four-Sacral One Posterior Lateral Fusion   TUBAL LIGATION      Family History Father- crc 79s; brother- colon polyps 51 No h/o GI disease or malignancy  Review of Systems   Constitutional:  Negative for activity change, appetite change, chills, diaphoresis, fatigue, fever and unexpected weight change.  HENT:  Negative for trouble swallowing and voice change.   Respiratory:  Negative for shortness of breath and wheezing.   Cardiovascular:  Negative for chest pain, palpitations and leg swelling.  Gastrointestinal:  Negative for abdominal distention, abdominal pain, anal bleeding, blood in stool, constipation, diarrhea, nausea, rectal pain and vomiting.  Musculoskeletal:  Negative for arthralgias and myalgias.  Skin:  Negative for color change and pallor.  Neurological:  Negative for dizziness, syncope and weakness.  Psychiatric/Behavioral:  Negative for confusion.   All other systems reviewed and are negative.    Medications No current facility-administered medications on file prior to encounter.   Current Outpatient Medications on File Prior to Encounter  Medication Sig Dispense Refill   buPROPion (WELLBUTRIN XL) 150 MG 24 hr tablet Take by mouth.     escitalopram (LEXAPRO) 10 MG tablet Take by mouth.     ferrous sulfate 325 (65 FE) MG tablet Take 325 mg by mouth 2 (two) times daily with a meal.     gabapentin  (NEURONTIN ) 300 MG capsule Take 300 mg by mouth 3 (three) times daily.     levothyroxine  (SYNTHROID ) 200 MCG tablet Take by mouth.     meloxicam (MOBIC) 15 MG tablet Take 15 mg by mouth daily.     oxybutynin  (DITROPAN  XL) 15 MG 24 hr tablet Take 15 mg by mouth  at bedtime.     aspirin -acetaminophen -caffeine  (EXCEDRIN  MIGRAINE) 250-250-65 MG tablet Take 1 tablet by mouth every 6 (six) hours as needed for headache. (Patient not taking: Reported on 04/14/2024)     rOPINIRole  (REQUIP ) 0.25 MG tablet Take one two hours before bedtime, may increase to two after two weeks. (Patient not taking: Reported on 04/14/2024) 180 tablet 3   ropinirole  (REQUIP ) 5 MG tablet Take two tablets 2 hours before bedtime. (Patient not taking: Reported on 04/14/2024) 180 tablet 1     Pertinent medications related to GI and procedure were reviewed by me with the patient prior to the procedure   Current Facility-Administered Medications:    0.9 %  sodium chloride  infusion, , Intravenous, Continuous, Onita Elspeth Sharper, DO, Last Rate: 20 mL/hr at 04/14/24 0832, New Bag at 04/14/24 9167  sodium chloride  20 mL/hr at 04/14/24 9167       Allergies  Allergen Reactions   Shellfish Allergy Anaphylaxis and Hives   Allergies were reviewed by me prior to the procedure  Objective   Body mass index is 44.11 kg/m. Vitals:   04/14/24 0816  BP: (!) 132/95  Pulse: 95  Resp: 20  Temp: (!) 96 F (35.6 C)  TempSrc: Temporal  SpO2: 98%  Weight: 112.9 kg  Height: 5' 3 (1.6 m)     Physical Exam Vitals and nursing note reviewed.  Constitutional:      General: She is not in acute distress.    Appearance: Normal appearance. She is obese. She is not ill-appearing, toxic-appearing or diaphoretic.  HENT:     Head: Normocephalic and atraumatic.     Nose: Nose normal.     Mouth/Throat:     Mouth: Mucous membranes are moist.     Pharynx: Oropharynx is clear.  Eyes:     General: No scleral icterus.    Extraocular Movements: Extraocular movements intact.  Cardiovascular:     Rate and Rhythm: Normal rate and regular rhythm.     Heart sounds: Normal heart sounds. No murmur heard.    No friction rub. No gallop.  Pulmonary:     Effort: Pulmonary effort is normal. No respiratory distress.     Breath sounds: Normal breath sounds. No wheezing, rhonchi or rales.  Abdominal:     General: Bowel sounds are normal. There is no distension.     Palpations: Abdomen is soft.     Tenderness: There is no abdominal tenderness. There is no guarding or rebound.  Musculoskeletal:     Cervical back: Neck supple.     Right lower leg: No edema.     Left lower leg: No edema.  Skin:    General: Skin is warm and dry.     Coloration: Skin is not jaundiced or pale.  Neurological:      General: No focal deficit present.     Mental Status: She is alert and oriented to person, place, and time. Mental status is at baseline.  Psychiatric:        Mood and Affect: Mood normal.        Behavior: Behavior normal.        Thought Content: Thought content normal.        Judgment: Judgment normal.      Assessment:  Ms. RAIA AMICO is a 43 y.o. female  who presents today for Colonoscopy for Colorectal cancer screening-family history of colon cancer .  Plan:  Colonoscopy with possible intervention today  Colonoscopy with possible biopsy, control of bleeding, polypectomy,  and interventions as necessary has been discussed with the patient/patient representative. Informed consent was obtained from the patient/patient representative after explaining the indication, nature, and risks of the procedure including but not limited to death, bleeding, perforation, missed neoplasm/lesions, cardiorespiratory compromise, and reaction to medications. Opportunity for questions was given and appropriate answers were provided. Patient/patient representative has verbalized understanding is amenable to undergoing the procedure.   Elspeth Ozell Jungling, DO  [facility] Gastroenterology  Portions of the record may have been created with voice recognition software. Occasional wrong-word or 'sound-a-like' substitutions may have occurred due to the inherent limitations of voice recognition software.  Read the chart carefully and recognize, using context, where substitutions may have occurred.

## 2024-05-31 ENCOUNTER — Other Ambulatory Visit: Payer: Self-pay | Admitting: Sports Medicine

## 2024-05-31 DIAGNOSIS — M2392 Unspecified internal derangement of left knee: Secondary | ICD-10-CM

## 2024-05-31 DIAGNOSIS — S83412A Sprain of medial collateral ligament of left knee, initial encounter: Secondary | ICD-10-CM

## 2024-05-31 DIAGNOSIS — S8992XA Unspecified injury of left lower leg, initial encounter: Secondary | ICD-10-CM

## 2024-06-08 ENCOUNTER — Ambulatory Visit
Admission: RE | Admit: 2024-06-08 | Discharge: 2024-06-08 | Disposition: A | Discharge status: Home / Self Care | Source: Ambulatory Visit | Attending: Sports Medicine | Admitting: Sports Medicine

## 2024-06-08 DIAGNOSIS — S83412A Sprain of medial collateral ligament of left knee, initial encounter: Secondary | ICD-10-CM | POA: Diagnosis present

## 2024-06-08 DIAGNOSIS — S8992XA Unspecified injury of left lower leg, initial encounter: Secondary | ICD-10-CM | POA: Insufficient documentation

## 2024-06-08 DIAGNOSIS — M2392 Unspecified internal derangement of left knee: Secondary | ICD-10-CM | POA: Diagnosis present
